# Patient Record
Sex: Female | Born: 1945 | Race: White | Hispanic: No | Smoking: Current every day smoker
Health system: Southern US, Community
[De-identification: ages and names within clinical notes are randomized; demographics above are authoritative.]

## PROBLEM LIST (undated history)

## (undated) DIAGNOSIS — M51369 Other intervertebral disc degeneration, lumbar region without mention of lumbar back pain or lower extremity pain: Secondary | ICD-10-CM

## (undated) DIAGNOSIS — J449 Chronic obstructive pulmonary disease, unspecified: Secondary | ICD-10-CM

## (undated) DIAGNOSIS — F410 Panic disorder [episodic paroxysmal anxiety] without agoraphobia: Secondary | ICD-10-CM

## (undated) DIAGNOSIS — I341 Nonrheumatic mitral (valve) prolapse: Secondary | ICD-10-CM

## (undated) DIAGNOSIS — M5136 Other intervertebral disc degeneration, lumbar region: Secondary | ICD-10-CM

## (undated) DIAGNOSIS — E7800 Pure hypercholesterolemia, unspecified: Secondary | ICD-10-CM

## (undated) DIAGNOSIS — K5790 Diverticulosis of intestine, part unspecified, without perforation or abscess without bleeding: Secondary | ICD-10-CM

## (undated) HISTORY — PX: ABDOMINAL HYSTERECTOMY: SHX81

---

## 2013-05-10 DIAGNOSIS — J40 Bronchitis, not specified as acute or chronic: Secondary | ICD-10-CM | POA: Diagnosis not present

## 2013-05-10 DIAGNOSIS — M545 Low back pain, unspecified: Secondary | ICD-10-CM | POA: Diagnosis not present

## 2013-06-23 DIAGNOSIS — R002 Palpitations: Secondary | ICD-10-CM | POA: Diagnosis not present

## 2013-06-23 DIAGNOSIS — Z0000 Encounter for general adult medical examination without abnormal findings: Secondary | ICD-10-CM | POA: Diagnosis not present

## 2013-06-23 DIAGNOSIS — J449 Chronic obstructive pulmonary disease, unspecified: Secondary | ICD-10-CM | POA: Diagnosis not present

## 2013-06-23 DIAGNOSIS — K5730 Diverticulosis of large intestine without perforation or abscess without bleeding: Secondary | ICD-10-CM | POA: Diagnosis not present

## 2013-06-23 DIAGNOSIS — E559 Vitamin D deficiency, unspecified: Secondary | ICD-10-CM | POA: Diagnosis not present

## 2013-06-23 DIAGNOSIS — E041 Nontoxic single thyroid nodule: Secondary | ICD-10-CM | POA: Diagnosis not present

## 2013-07-19 DIAGNOSIS — J45909 Unspecified asthma, uncomplicated: Secondary | ICD-10-CM | POA: Diagnosis not present

## 2013-07-19 DIAGNOSIS — R059 Cough, unspecified: Secondary | ICD-10-CM | POA: Diagnosis not present

## 2013-07-19 DIAGNOSIS — M949 Disorder of cartilage, unspecified: Secondary | ICD-10-CM | POA: Diagnosis not present

## 2013-07-19 DIAGNOSIS — R0602 Shortness of breath: Secondary | ICD-10-CM | POA: Diagnosis not present

## 2013-07-19 DIAGNOSIS — E785 Hyperlipidemia, unspecified: Secondary | ICD-10-CM | POA: Diagnosis not present

## 2013-07-19 DIAGNOSIS — N958 Other specified menopausal and perimenopausal disorders: Secondary | ICD-10-CM | POA: Diagnosis not present

## 2013-07-19 DIAGNOSIS — J019 Acute sinusitis, unspecified: Secondary | ICD-10-CM | POA: Diagnosis not present

## 2013-07-19 DIAGNOSIS — R05 Cough: Secondary | ICD-10-CM | POA: Diagnosis not present

## 2013-07-19 DIAGNOSIS — M899 Disorder of bone, unspecified: Secondary | ICD-10-CM | POA: Diagnosis not present

## 2013-07-19 DIAGNOSIS — E041 Nontoxic single thyroid nodule: Secondary | ICD-10-CM | POA: Diagnosis not present

## 2013-07-19 DIAGNOSIS — R51 Headache: Secondary | ICD-10-CM | POA: Diagnosis not present

## 2013-07-19 DIAGNOSIS — Z79899 Other long term (current) drug therapy: Secondary | ICD-10-CM | POA: Diagnosis not present

## 2013-07-20 DIAGNOSIS — K921 Melena: Secondary | ICD-10-CM | POA: Diagnosis not present

## 2013-08-17 DIAGNOSIS — J984 Other disorders of lung: Secondary | ICD-10-CM | POA: Diagnosis not present

## 2013-08-17 DIAGNOSIS — M542 Cervicalgia: Secondary | ICD-10-CM | POA: Diagnosis not present

## 2013-08-17 DIAGNOSIS — I369 Nonrheumatic tricuspid valve disorder, unspecified: Secondary | ICD-10-CM | POA: Diagnosis not present

## 2013-08-17 DIAGNOSIS — I359 Nonrheumatic aortic valve disorder, unspecified: Secondary | ICD-10-CM | POA: Diagnosis not present

## 2013-08-17 DIAGNOSIS — I672 Cerebral atherosclerosis: Secondary | ICD-10-CM | POA: Diagnosis not present

## 2013-08-17 DIAGNOSIS — R220 Localized swelling, mass and lump, head: Secondary | ICD-10-CM | POA: Diagnosis not present

## 2013-08-17 DIAGNOSIS — M25549 Pain in joints of unspecified hand: Secondary | ICD-10-CM | POA: Diagnosis not present

## 2013-08-17 DIAGNOSIS — R0602 Shortness of breath: Secondary | ICD-10-CM | POA: Diagnosis not present

## 2013-08-17 DIAGNOSIS — R221 Localized swelling, mass and lump, neck: Secondary | ICD-10-CM | POA: Diagnosis not present

## 2013-08-17 DIAGNOSIS — J309 Allergic rhinitis, unspecified: Secondary | ICD-10-CM | POA: Diagnosis not present

## 2013-08-17 DIAGNOSIS — I059 Rheumatic mitral valve disease, unspecified: Secondary | ICD-10-CM | POA: Diagnosis not present

## 2013-09-30 DIAGNOSIS — R109 Unspecified abdominal pain: Secondary | ICD-10-CM | POA: Diagnosis not present

## 2013-09-30 DIAGNOSIS — I2510 Atherosclerotic heart disease of native coronary artery without angina pectoris: Secondary | ICD-10-CM | POA: Diagnosis not present

## 2013-09-30 DIAGNOSIS — M899 Disorder of bone, unspecified: Secondary | ICD-10-CM | POA: Diagnosis not present

## 2013-09-30 DIAGNOSIS — E039 Hypothyroidism, unspecified: Secondary | ICD-10-CM | POA: Diagnosis not present

## 2013-12-09 DIAGNOSIS — H43819 Vitreous degeneration, unspecified eye: Secondary | ICD-10-CM | POA: Diagnosis not present

## 2013-12-15 DIAGNOSIS — Z1231 Encounter for screening mammogram for malignant neoplasm of breast: Secondary | ICD-10-CM | POA: Diagnosis not present

## 2014-01-20 DIAGNOSIS — B009 Herpesviral infection, unspecified: Secondary | ICD-10-CM | POA: Diagnosis not present

## 2014-01-20 DIAGNOSIS — F4389 Other reactions to severe stress: Secondary | ICD-10-CM | POA: Diagnosis not present

## 2014-01-20 DIAGNOSIS — Z23 Encounter for immunization: Secondary | ICD-10-CM | POA: Diagnosis not present

## 2014-01-20 DIAGNOSIS — F438 Other reactions to severe stress: Secondary | ICD-10-CM | POA: Diagnosis not present

## 2014-01-20 DIAGNOSIS — R002 Palpitations: Secondary | ICD-10-CM | POA: Diagnosis not present

## 2014-01-20 DIAGNOSIS — H6690 Otitis media, unspecified, unspecified ear: Secondary | ICD-10-CM | POA: Diagnosis not present

## 2014-01-21 DIAGNOSIS — N958 Other specified menopausal and perimenopausal disorders: Secondary | ICD-10-CM | POA: Diagnosis not present

## 2014-02-02 DIAGNOSIS — J449 Chronic obstructive pulmonary disease, unspecified: Secondary | ICD-10-CM | POA: Diagnosis not present

## 2014-02-02 DIAGNOSIS — R109 Unspecified abdominal pain: Secondary | ICD-10-CM | POA: Diagnosis not present

## 2014-02-02 DIAGNOSIS — F411 Generalized anxiety disorder: Secondary | ICD-10-CM | POA: Diagnosis not present

## 2014-02-02 DIAGNOSIS — N958 Other specified menopausal and perimenopausal disorders: Secondary | ICD-10-CM | POA: Diagnosis not present

## 2014-02-09 DIAGNOSIS — R102 Pelvic and perineal pain: Secondary | ICD-10-CM | POA: Diagnosis not present

## 2014-02-16 DIAGNOSIS — K824 Cholesterolosis of gallbladder: Secondary | ICD-10-CM | POA: Diagnosis not present

## 2014-02-16 DIAGNOSIS — D1803 Hemangioma of intra-abdominal structures: Secondary | ICD-10-CM | POA: Diagnosis not present

## 2014-04-16 ENCOUNTER — Encounter (HOSPITAL_BASED_OUTPATIENT_CLINIC_OR_DEPARTMENT_OTHER): Payer: Self-pay | Admitting: *Deleted

## 2014-04-16 ENCOUNTER — Emergency Department (HOSPITAL_BASED_OUTPATIENT_CLINIC_OR_DEPARTMENT_OTHER): Payer: Medicare Other

## 2014-04-16 ENCOUNTER — Emergency Department (HOSPITAL_BASED_OUTPATIENT_CLINIC_OR_DEPARTMENT_OTHER)
Admission: EM | Admit: 2014-04-16 | Discharge: 2014-04-16 | Disposition: A | Discharge status: Home / Self Care | Payer: Medicare Other | Attending: Emergency Medicine | Admitting: Emergency Medicine

## 2014-04-16 DIAGNOSIS — F410 Panic disorder [episodic paroxysmal anxiety] without agoraphobia: Secondary | ICD-10-CM | POA: Diagnosis not present

## 2014-04-16 DIAGNOSIS — H9209 Otalgia, unspecified ear: Secondary | ICD-10-CM | POA: Diagnosis not present

## 2014-04-16 DIAGNOSIS — N3000 Acute cystitis without hematuria: Secondary | ICD-10-CM | POA: Diagnosis not present

## 2014-04-16 DIAGNOSIS — R51 Headache: Secondary | ICD-10-CM | POA: Diagnosis not present

## 2014-04-16 DIAGNOSIS — R059 Cough, unspecified: Secondary | ICD-10-CM

## 2014-04-16 DIAGNOSIS — Z8679 Personal history of other diseases of the circulatory system: Secondary | ICD-10-CM | POA: Insufficient documentation

## 2014-04-16 DIAGNOSIS — Z79899 Other long term (current) drug therapy: Secondary | ICD-10-CM | POA: Diagnosis not present

## 2014-04-16 DIAGNOSIS — B9689 Other specified bacterial agents as the cause of diseases classified elsewhere: Secondary | ICD-10-CM | POA: Diagnosis not present

## 2014-04-16 DIAGNOSIS — M791 Myalgia: Secondary | ICD-10-CM | POA: Insufficient documentation

## 2014-04-16 DIAGNOSIS — Z720 Tobacco use: Secondary | ICD-10-CM | POA: Insufficient documentation

## 2014-04-16 DIAGNOSIS — R509 Fever, unspecified: Secondary | ICD-10-CM

## 2014-04-16 DIAGNOSIS — Z7951 Long term (current) use of inhaled steroids: Secondary | ICD-10-CM | POA: Diagnosis not present

## 2014-04-16 DIAGNOSIS — R0989 Other specified symptoms and signs involving the circulatory and respiratory systems: Secondary | ICD-10-CM | POA: Diagnosis not present

## 2014-04-16 DIAGNOSIS — J449 Chronic obstructive pulmonary disease, unspecified: Secondary | ICD-10-CM | POA: Insufficient documentation

## 2014-04-16 DIAGNOSIS — Z8719 Personal history of other diseases of the digestive system: Secondary | ICD-10-CM | POA: Diagnosis not present

## 2014-04-16 DIAGNOSIS — E780 Pure hypercholesterolemia: Secondary | ICD-10-CM | POA: Diagnosis not present

## 2014-04-16 DIAGNOSIS — R05 Cough: Secondary | ICD-10-CM

## 2014-04-16 HISTORY — DX: Diverticulosis of intestine, part unspecified, without perforation or abscess without bleeding: K57.90

## 2014-04-16 HISTORY — DX: Other intervertebral disc degeneration, lumbar region: M51.36

## 2014-04-16 HISTORY — DX: Nonrheumatic mitral (valve) prolapse: I34.1

## 2014-04-16 HISTORY — DX: Chronic obstructive pulmonary disease, unspecified: J44.9

## 2014-04-16 HISTORY — DX: Panic disorder (episodic paroxysmal anxiety): F41.0

## 2014-04-16 HISTORY — DX: Pure hypercholesterolemia, unspecified: E78.00

## 2014-04-16 HISTORY — DX: Other intervertebral disc degeneration, lumbar region without mention of lumbar back pain or lower extremity pain: M51.369

## 2014-04-16 LAB — CBC WITH DIFFERENTIAL/PLATELET
Basophils Absolute: 0 10*3/uL (ref 0.0–0.1)
Basophils Relative: 0 % (ref 0–1)
Eosinophils Absolute: 0 10*3/uL (ref 0.0–0.7)
Eosinophils Relative: 0 % (ref 0–5)
HCT: 43.4 % (ref 36.0–46.0)
Hemoglobin: 14.4 g/dL (ref 12.0–15.0)
Lymphocytes Relative: 6 % — ABNORMAL LOW (ref 12–46)
Lymphs Abs: 1 10*3/uL (ref 0.7–4.0)
MCH: 32.7 pg (ref 26.0–34.0)
MCHC: 33.2 g/dL (ref 30.0–36.0)
MCV: 98.4 fL (ref 78.0–100.0)
Monocytes Absolute: 1 10*3/uL (ref 0.1–1.0)
Monocytes Relative: 7 % (ref 3–12)
Neutro Abs: 12.9 10*3/uL — ABNORMAL HIGH (ref 1.7–7.7)
Neutrophils Relative %: 87 % — ABNORMAL HIGH (ref 43–77)
Platelets: 175 10*3/uL (ref 150–400)
RBC: 4.41 MIL/uL (ref 3.87–5.11)
RDW: 13.2 % (ref 11.5–15.5)
WBC: 14.9 10*3/uL — ABNORMAL HIGH (ref 4.0–10.5)

## 2014-04-16 LAB — URINALYSIS, ROUTINE W REFLEX MICROSCOPIC
Bilirubin Urine: NEGATIVE
Glucose, UA: NEGATIVE mg/dL
Hgb urine dipstick: NEGATIVE
Ketones, ur: NEGATIVE mg/dL
Nitrite: NEGATIVE
Protein, ur: NEGATIVE mg/dL
Specific Gravity, Urine: 1.006 (ref 1.005–1.030)
Urobilinogen, UA: 0.2 mg/dL (ref 0.0–1.0)
pH: 5.5 (ref 5.0–8.0)

## 2014-04-16 LAB — COMPREHENSIVE METABOLIC PANEL
ALT: 31 U/L (ref 0–35)
AST: 36 U/L (ref 0–37)
Albumin: 3.7 g/dL (ref 3.5–5.2)
Alkaline Phosphatase: 89 U/L (ref 39–117)
Anion gap: 15 (ref 5–15)
BUN: 12 mg/dL (ref 6–23)
CO2: 23 mEq/L (ref 19–32)
Calcium: 9.5 mg/dL (ref 8.4–10.5)
Chloride: 101 mEq/L (ref 96–112)
Creatinine, Ser: 0.8 mg/dL (ref 0.50–1.10)
GFR calc Af Amer: 86 mL/min — ABNORMAL LOW (ref >90)
GFR calc non Af Amer: 74 mL/min — ABNORMAL LOW (ref >90)
Glucose, Bld: 156 mg/dL — ABNORMAL HIGH (ref 70–99)
Potassium: 4.1 mEq/L (ref 3.7–5.3)
Sodium: 139 mEq/L (ref 137–147)
Total Bilirubin: 0.3 mg/dL (ref 0.3–1.2)
Total Protein: 7.3 g/dL (ref 6.0–8.3)

## 2014-04-16 LAB — URINE MICROSCOPIC-ADD ON

## 2014-04-16 LAB — LIPASE, BLOOD: Lipase: 56 U/L (ref 11–59)

## 2014-04-16 LAB — RAPID STREP SCREEN (MED CTR MEBANE ONLY): Streptococcus, Group A Screen (Direct): NEGATIVE

## 2014-04-16 MED ORDER — SODIUM CHLORIDE 0.9 % IV BOLUS (SEPSIS)
1000.0000 mL | Freq: Once | INTRAVENOUS | Status: AC
  Administered 2014-04-16: 1000 mL via INTRAVENOUS

## 2014-04-16 MED ORDER — ACETAMINOPHEN 325 MG PO TABS
650.0000 mg | ORAL_TABLET | Freq: Once | ORAL | Status: AC
Start: 2014-04-16 — End: 2014-04-16
  Administered 2014-04-16: 650 mg via ORAL
  Filled 2014-04-16: qty 2

## 2014-04-16 MED ORDER — CEPHALEXIN 500 MG PO CAPS: 500.0000 mg | ORAL_CAPSULE | Freq: Two times a day (BID) | ORAL | Status: AC

## 2014-04-16 NOTE — Discharge Instructions (Signed)
Fever, Adult A fever is a higher than normal body temperature. In an adult, an oral temperature around 98.6 F (37 C) is considered normal. A temperature of 100.4 F (38 C) or higher is generally considered a fever. Mild or moderate fevers generally have no long-term effects and often do not require treatment. Extreme fever (greater than or equal to 106 F or 41.1 C) can cause seizures. The sweating that may occur with repeated or prolonged fever may cause dehydration. Elderly people can develop confusion during a fever. A measured temperature can vary with:  Age.  Time of day.  Method of measurement (mouth, underarm, rectal, or ear). The fever is confirmed by taking a temperature with a thermometer. Temperatures can be taken different ways. Some methods are accurate and some are not.  An oral temperature is used most commonly. Electronic thermometers are fast and accurate.  An ear temperature will only be accurate if the thermometer is positioned as recommended by the manufacturer.  A rectal temperature is accurate and done for those adults who have a condition where an oral temperature cannot be taken.  An underarm (axillary) temperature is not accurate and not recommended. Fever is a symptom, not a disease.  CAUSES   Infections commonly cause fever.  Some noninfectious causes for fever include:  Some arthritis conditions.  Some thyroid or adrenal gland conditions.  Some immune system conditions.  Some types of cancer.  A medicine reaction.  High doses of certain street drugs such as methamphetamine.  Dehydration.  Exposure to high outside or room temperatures.  Occasionally, the source of a fever cannot be determined. This is sometimes called a "fever of unknown origin" (FUO).  Some situations may lead to a temporary rise in body temperature that may go away on its own. Examples are:  Childbirth.  Surgery.  Intense exercise. HOME CARE INSTRUCTIONS   Take  appropriate medicines for fever. Follow dosing instructions carefully. If you use acetaminophen to reduce the fever, be careful to avoid taking other medicines that also contain acetaminophen. Do not take aspirin for a fever if you are younger than age 67. There is an association with Reye's syndrome. Reye's syndrome is a rare but potentially deadly disease.  If an infection is present and antibiotics have been prescribed, take them as directed. Finish them even if you start to feel better.  Rest as needed.  Maintain an adequate fluid intake. To prevent dehydration during an illness with prolonged or recurrent fever, you may need to drink extra fluid.Drink enough fluids to keep your urine clear or pale yellow.  Sponging or bathing with room temperature water may help reduce body temperature. Do not use ice water or alcohol sponge baths.  Dress comfortably, but do not over-bundle. SEEK MEDICAL CARE IF:   You are unable to keep fluids down.  You develop vomiting or diarrhea.  You are not feeling at least partly better after 3 days.  You develop new symptoms or problems. SEEK IMMEDIATE MEDICAL CARE IF:   You have shortness of breath or trouble breathing.  You develop excessive weakness.  You are dizzy or you faint.  You are extremely thirsty or you are making little or no urine.  You develop new pain that was not there before (such as in the head, neck, chest, back, or abdomen).  You have persistent vomiting and diarrhea for more than 1 to 2 days.  You develop a stiff neck or your eyes become sensitive to light.  You develop a  skin rash. °· You have a fever or persistent symptoms for more than 2 to 3 days. °· You have a fever and your symptoms suddenly get worse. °MAKE SURE YOU:  °· Understand these instructions. °· Will watch your condition. °· Will get help right away if you are not doing well or get worse. °Document Released: 10/16/2000 Document Revised: 09/06/2013 Document  Reviewed: 02/21/2011 °ExitCare® Patient Information ©2015 [employer]. This information is not intended to replace advice given to you by your health care provider. Make sure you discuss any questions you have with your health care provider. ° °Urinary Tract Infection °Urinary tract infections (UTIs) can develop anywhere along your urinary tract. Your urinary tract is your body's drainage system for removing wastes and extra water. Your urinary tract includes two kidneys, two ureters, a bladder, and a urethra. Your kidneys are a pair of bean-shaped organs. Each kidney is about the size of your fist. They are located below your ribs, one on each side of your spine. °CAUSES °Infections are caused by microbes, which are microscopic organisms, including fungi, viruses, and bacteria. These organisms are so small that they can only be seen through a microscope. Bacteria are the microbes that most commonly cause UTIs. °SYMPTOMS  °Symptoms of UTIs may vary by age and gender of the patient and by the location of the infection. Symptoms in young women typically include a frequent and intense urge to urinate and a painful, burning feeling in the bladder or urethra during urination. Older women and men are more likely to be tired, shaky, and weak and have muscle aches and abdominal pain. A fever may mean the infection is in your kidneys. Other symptoms of a kidney infection include pain in your back or sides below the ribs, nausea, and vomiting. °DIAGNOSIS °To diagnose a UTI, your caregiver will ask you about your symptoms. Your caregiver also will ask to provide a urine sample. The urine sample will be tested for bacteria and white blood cells. White blood cells are made by your body to help fight infection. °TREATMENT  °Typically, UTIs can be treated with medication. Because most UTIs are caused by a bacterial infection, they usually can be treated with the use of antibiotics. The choice of antibiotic and length of  treatment depend on your symptoms and the type of bacteria causing your infection. °HOME CARE INSTRUCTIONS °· If you were prescribed antibiotics, take them exactly as your caregiver instructs you. Finish the medication even if you feel better after you have only taken some of the medication. °· Drink enough water and fluids to keep your urine clear or pale yellow. °· Avoid caffeine, tea, and carbonated beverages. They tend to irritate your bladder. °· Empty your bladder often. Avoid holding urine for long periods of time. °· Empty your bladder before and after sexual intercourse. °· After a bowel movement, women should cleanse from front to back. Use each tissue only once. °SEEK MEDICAL CARE IF:  °· You have back pain. °· You develop a fever. °· Your symptoms do not begin to resolve within 3 days. °SEEK IMMEDIATE MEDICAL CARE IF:  °· You have severe back pain or lower abdominal pain. °· You develop chills. °· You have nausea or vomiting. °· You have continued burning or discomfort with urination. °MAKE SURE YOU:  °· Understand these instructions. °· Will watch your condition. °· Will get help right away if you are not doing well or get worse. °Document Released: 01/30/2005 Document Revised: 10/22/2011 Document Reviewed: 05/31/2011 °  ExitCare Patient Information 2015 [place]. This information is not intended to replace advice given to you by your health care provider. Make sure you discuss any questions you have with your health care provider.   Emergency Department Resource Guide 1) Find a Doctor and Pay Out of Pocket Although you won't have to find out who is covered by your insurance plan, it is a good idea to ask around and get recommendations. You will then need to call the office and see if the doctor you have chosen will accept you as a new patient and what types of options they offer for patients who are self-pay. Some doctors offer discounts or will set up payment plans for their patients who do  not have insurance, but you will need to ask so you aren't surprised when you get to your appointment.  2) Contact Your Local Health Department Not all health departments have doctors that can see patients for sick visits, but many do, so it is worth a call to see if yours does. If you don't know where your local health department is, you can check in your phone book. The CDC also has a tool to help you locate your state's health department, and many state websites also have listings of all of their local health departments.  3) Find a [facility] If your illness is not likely to be very severe or complicated, you may want to try a walk in clinic. These are popping up all over the country in pharmacies, drugstores, and shopping centers. They're usually staffed by nurse practitioners or physician assistants that have been trained to treat common illnesses and complaints. They're usually fairly quick and inexpensive. However, if you have serious medical issues or chronic medical problems, these are probably not your best option.  No Primary Care Doctor: - Call Health Connect at  [phone] - they can help you locate a primary care doctor that  accepts your insurance, provides certain services, etc. - Physician Referral Service- [phone]  Chronic Pain Problems: Organization         Address  Phone   Notes  [facility]  [phone] Patients need to be referred by their primary care doctor.   Medication Assistance: Organization         Address  Phone   Notes  [facility] Medication [facility] [address] [place], [place] [address] [phone] --Must be a resident of [facility] -- Must have NO insurance coverage whatsoever (no Medicaid/ Medicare, etc.) -- The pt. MUST have a primary care doctor that directs their care regularly and follows them in the community   [facility]ist  [phone]   [employer]  [phone]    Agencies that  provide inexpensive medical care: Organization         Address  Phone   Notes  [place]  [phone]   [facility]    [phone]   [facility] [place], [place] [address] [phone]   [place] [address], [place] [phone]   Planned Parenthood    [phone]   [facility]    [phone]   [place] and [place] [address], [place] Phone:  [phone], Fax:  [phone] Hours of Operation:  9 am - 6 pm, M-F.  Also accepts Medicaid/Medicare and self-pay.  [place]  Center for [place] [address], [address], [place] Phone: [phone], Fax: [phone]. Hours of Operation:  8:30 am - 5:30 pm, M-F.  Also accepts Medicaid and self-pay.  [facility] [place] [address], [place] Phone: [phone]   [place], [place], [place] [phone], Ext. [phone] Mondays & Thursdays: 7-9 AM.  First 15 patients are seen on a first come, first serve basis.    [place] Providers:  Organization         Address  Phone   Notes  [facility] [address], [place] [phone] Also accepts self-pay patients.  [facility] [address], [place]  [phone]   [place], [address], [place] [phone]   [facility] [address], [place] [phone]   Lucianne Lei [address], [place]   [phone] Only accepts [place] Access [place] patients after they have their name applied to their card.   Self-Pay (no insurance) in [facility]:  Organization         Address  Phone   Notes  Sickle Cell Patients, [facility] [place] [phone]   [facility]  Urgent Care [place] [phone]   [facility] [place]  [place], [place], [place] [phone]   [facility]/Dr. Osei-Bonsu  [address], [place] or [address], [place] [phone] Phone number for both [place] and [place] locations is the same.  Urgent Medical and [facility] [place] [address] [place] [phone]   [facility] [address] [place] or [address] [phone]   [facility] [address] [place] [phone], phone; [phone], fax Sees patients 1st and 3rd Saturday of every month.  Must not qualify for public or private insurance (i.e. Medicaid, Medicare, [place] Health Choice, Veterans' Benefits)  Household income should be no more than 200% of the poverty level The clinic cannot treat you if you are pregnant or think you are pregnant  Sexually transmitted diseases are not treated at the clinic.    Dental Care: Organization         Address  Phone  Notes  [facility] Department of [facility] [place] [phone] Accepts children up to age 38 who are enrolled in [place] or [place]; pregnant women with a Medicaid card; and children who have applied for Medicaid or [place] Health Choice, but were declined, whose parents can pay a reduced fee at time of service.  [facility] of [facility]  [address], [place] [phone] Accepts children up to age 40 who are enrolled in [place] or [place]; pregnant women with a Medicaid card; and children who have applied for Medicaid or [place] Health Choice, but were declined, whose parents can pay a reduced fee at time of service.  [place] Adult Dental Access PROGRAM  [place] [phone] Patients are seen by appointment only. Walk-ins  are not accepted. [place] will see patients 41 years of age and older. Monday - Tuesday (8am-5pm) Most Wednesdays (8:30-5pm) $30 per visit, cash  only  [facility] Adult Dental Access PROGRAM  [address], [facility] [phone] Patients are seen by appointment only. Walk-ins are not accepted. [place] will see patients 65 years of age and older. One Wednesday Evening (Monthly: Volunteer Based).  $30 per visit, cash only  [place]  [phone] for adults; Children under age 66, call Graduate Pediatric Dentistry at [phone]. Children aged 82-14, please call [phone] to request a pediatric application.  Dental services are provided in all areas of dental care including fillings, crowns and bridges, complete and partial dentures, implants, gum treatment, root canals, and extractions. Preventive care is also provided. Treatment is provided to both adults and children. Patients are selected via a lottery and there is often a waiting list.   [facility] [address] [place]  [phone] [email or web address]   [facility] [address] [place], [place] [phone], Ext. [phone] Second and Fourth Thursday of each month, opens at 6:30 AM; Clinic ends at 9 AM.  Patients are seen on a first-come first-served basis, and a limited number are seen during each clinic.   [facility]  [address] [place] D[place]er [place], [place] [phone]   Eligibility Requirements You must have lived in [place], [place], or [place] counties for at least the last three months.   You cannot be eligible for state or federal sponsored [employer], including [employer], [place], or [employer].   You generally cannot be eligible for healthcare insurance through your employer.    How to apply: Eligibility screenings are held every Tuesday and Wednesday afternoon from 1:00 pm until 4:00 pm. You do not need an appointment  for the interview!  [facility] [address] [place], [place]   [place]  [place] Department  [place]  [phone]    Behavioral Health Resources in the Community: Intensive Outpatient Programs Organization         Address  Phone  Notes  [place]. [address] [place], [place] [phone]   [facility] Outpatient [address], [place], [place]   ADS: Alcohol & Drug Svcs [address] [place], [place]   [place] [address],  [place], [place] or [phone]   Substance Abuse Resources Organization         Address  Phone  Notes  Alcohol and Drug Services  [phone]   [place]  [phone]   [facility] [place]   Chinita Pester  [phone]   Residential & Outpatient Substance Abuse Program  [phone]   Psychological Services Organization         Address  Phone  Notes  [facility] [place]  [place]  [phone]   [place] [address], [place] or [phone]    Mobile Crisis Teams Organization         Address  Phone  Notes  [facility], Mobile Crisis Care Unit  [phone]   [facility]  [address] [place], [place]   [place] [address] [place] [phone]    Self-Help/Support Groups Organization         Address  Phone  Notes  [facility]oc[facility] of [place] - variety of support groups  [place] Call for more information  Narcotics Anonymous (NA), [facility] [address], [employer] [place]  2 meetings at this location   Special educational needs teacher         Address  Phone  Notes  ASAP Residential  Treatment [place],    [place]  [phone]   [facility]  [address], [place] [phone], [place], [place]   [place], [place] [phone] Admissions: 8am-3pm M-F  Incentives Substance [place] [address],    [place], [place] [phone]   [facility] [address] [place], [place], [place]   [facility] [address]  [place], [place]   [facility]ight Programs - Intensive Outpatient [address]een [address], [place], [place]   [facility] ([place] [address].,  [place], [place] [phone] or [phone]   [facility]idential Treatment Services (RTS) [address] [place], [place] Accepts Medicaid  [facility] [place] [address],  [place] [phone] Substance Abuse/Addiction Treatment   [facility] Organization         Address  Phone  Notes  [facility]  [phone]   Domenic Schwab, PhD [address] [place]lis [place], [place]   [phone] or [phone]   [place], [place] [phone]   [facility] [address] [place], [place] [phone] Insurance/Medicaid/sponsorship through [facility] and Families [address]                                    [place], [place] [phone] [place] [address], [place] [phone]    Dr. Adele Schilder  [phone]   [facility] Dept. 1) [address], [place] 2) [place] 3)  [place] [address], [place] [phone]  [phone]   [place] [phone] or [phone] (After Hours)

## 2014-04-16 NOTE — ED Provider Notes (Signed)
CSN: [id number]     Arrival date & time 04/16/14  1911 History  This chart was scribed for Artis Delay, MD by Stephania Fragmin, ED Scribe. This patient was seen in room MH08/MH08 and the patient's care was started at 8:09 PM.    Chief Complaint  Patient presents with  . Fever  . Influenza    The history is provided by the patient. No language interpreter was used.     HPI Comments: Chloe Green is a 68 y.o. female who presents to the Emergency Department complaining of cold-like symptoms that began 1.5 weeks ago, went away, and returned with worsening, flu-like symptoms yesterday. She and all her co-workers showed symptoms 3-5 days after sick contact with their boss. Patient complains of leg aches, fever, chills, intermittent ear aches and congestion, a throbbing headache, rhinorrhea alternating with congestion, and productive cough. Her aches, fever, and chills started yesterday; she did not have them when her symptoms started 1.5 weeks ago. Patient took Dayquil about 12 hours ago with minimal relief. She uses an inhaler for COPD that she denies needing to increase in usage with these symptoms. Patient has had a flu shot and pneumonia shot. She denies vomiting and diarrhea.    Past Medical History  Diagnosis Date  . COPD (chronic obstructive pulmonary disease)   . Mitral valve prolapse   . Hypercholesteremia   . Panic attacks   . Diverticulosis   . Degenerative disc disease, lumbar    Past Surgical History  Procedure Laterality Date  . Abdominal hysterectomy     No family history on file. History  Substance Use Topics  . Smoking status: Current Every Day Smoker  . Smokeless tobacco: Not on file  . Alcohol Use: No   OB History    No data available     Review of Systems  Constitutional: Positive for fever and chills.  HENT: Positive for congestion, ear pain, rhinorrhea and sore throat.   Respiratory: Positive for cough. Negative for shortness of breath.   Gastrointestinal:  Negative for vomiting and diarrhea.  Musculoskeletal: Positive for myalgias.  Neurological: Positive for headaches.  All other systems reviewed and are negative.     Allergies  Apple; Azelastine; Ciprofloxacin; Clavulanic acid; Doxycycline; Flagyl; and Other  Home Medications   Prior to Admission medications   Medication Sig Start Date End Date Taking? Authorizing Provider  atorvastatin (LIPITOR) 10 MG tablet Take 10 mg by mouth daily.   Yes Historical Provider, MD  Budesonide-Formoterol Fumarate (SYMBICORT IN) Inhale into the lungs.   Yes Historical Provider, MD  ClonazePAM (KLONOPIN PO) Take by mouth.   Yes Historical Provider, MD  co-enzyme Q-10 30 MG capsule Take by mouth.   Yes Historical Provider, MD  CYCLOBENZAPRINE HCL PO Take by mouth.   Yes Historical Provider, MD  DIAZEPAM PO Take by mouth.   Yes Historical Provider, MD  Dicyclomine HCl (BENTYL PO) Take by mouth.   Yes Historical Provider, MD  Levalbuterol HCl (XOPENEX IN) Inhale into the lungs.   Yes Historical Provider, MD  Nystatin-Triamcinolone (MYCOLOG II EX) Apply topically.   Yes Historical Provider, MD  Probiotic Product (SOLUBLE FIBER/PROBIOTICS PO) Take by mouth.   Yes Historical Provider, MD  Ranitidine HCl (ZANTAC PO) Take by mouth.   Yes Historical Provider, MD   BP 134/56 mmHg  Pulse 116  Temp(Src) 102.6 F (39.2 C) (Oral)  Resp 22  Wt 101 lb 1.6 oz (45.859 kg)  SpO2 93% Physical Exam  Constitutional: She is oriented to  person, place, and time. She appears well-developed and well-nourished. No distress.  HENT:  Head: Normocephalic and atraumatic.  Nose: Right sinus exhibits no maxillary sinus tenderness and no frontal sinus tenderness. Left sinus exhibits no maxillary sinus tenderness and no frontal sinus tenderness.  Mouth/Throat: Oropharynx is clear and moist.  Eyes: Conjunctivae are normal. Pupils are equal, round, and reactive to light. No scleral icterus.  Neck: Neck supple.  Cardiovascular:  Normal rate, regular rhythm, normal heart sounds and intact distal pulses.   No murmur heard. Pulmonary/Chest: Effort normal and breath sounds normal. No stridor. No respiratory distress. She has no wheezes (mild prolongation of expiration without wheezing). She has no rales.  Abdominal: Soft. Bowel sounds are normal. She exhibits no distension. There is no tenderness.  Musculoskeletal: Normal range of motion.  Neurological: She is alert and oriented to person, place, and time.  Skin: Skin is warm and dry. No rash noted.  Psychiatric: She has a normal mood and affect. Her behavior is normal.  Nursing note and vitals reviewed.   ED Course  Procedures (including critical care time)  DIAGNOSTIC STUDIES: Oxygen Saturation is 93% on room air, adequate by my interpretation.    COORDINATION OF CARE: 8:20 PM - Discussed treatment plan with pt at bedside which includes tests for bacterial infection and pt agreed to plan.   Labs Review Labs Reviewed  URINALYSIS, ROUTINE W REFLEX MICROSCOPIC - Abnormal; Notable for the following:    Leukocytes, UA TRACE (*)    All other components within normal limits  CBC WITH DIFFERENTIAL - Abnormal; Notable for the following:    WBC 14.9 (*)    Neutrophils Relative % 87 (*)    Neutro Abs 12.9 (*)    Lymphocytes Relative 6 (*)    All other components within normal limits  COMPREHENSIVE METABOLIC PANEL - Abnormal; Notable for the following:    Glucose, Bld 156 (*)    GFR calc non Af Amer 74 (*)    GFR calc Af Amer 86 (*)    All other components within normal limits  URINE MICROSCOPIC-ADD ON - Abnormal; Notable for the following:    Bacteria, UA MANY (*)    All other components within normal limits  RAPID STREP SCREEN  CULTURE, GROUP A STREP  URINE CULTURE  LIPASE, BLOOD    Imaging Review Dg Chest 2 View  04/16/2014   CLINICAL DATA:  Patient complaining of cough and congestion for 1 and half weeks. Fever today. History of COPD.  EXAM: CHEST  2  VIEW  COMPARISON:  None.  FINDINGS: Heart, mediastinum and hila are unremarkable.  Lungs are mildly hyperexpanded but clear. No pleural effusion. No pneumothorax.  Bony thorax is intact.  IMPRESSION: No acute cardiopulmonary disease.   Electronically Signed   By: Lajean Manes M.D.   On: 04/16/2014 20:14  All radiology studies independently viewed by me.      EKG Interpretation None      MDM   Final diagnoses:  Fever, unspecified fever cause  Acute cystitis without hematuria     68 yo female with 1.5 weeks of URI symptoms who now has worsening of her symptoms since last night and onset of fever, chills, and aches.  She is ill appearing, but nontoxic.  Her CXR is negative.  Plan to check labs, give antipyretics.  Initial impression is that she appears well enough to be discharged if her workup is unremarkable.      Workup notable for many urinary bacteria.  She has not had urinary symptoms.  However, given fever and leukocytosis, I feel she should be treated with abx.  Will send culture.  Flu also considered, but she had flu shot and has had URI symptoms for over a week.  I don't think tamiflu would be helpful.  Ultimately, she feels better and wants to go home.  She agrees to return if her symptoms worsen.    I personally performed the services described in this documentation, which was scribed in my presence. The recorded information has been reviewed and is accurate.       Artis Delay, MD 04/16/14 [phone]

## 2014-04-16 NOTE — ED Notes (Addendum)
C/o flu like sx for last 1.5 weeks. Recent sick contacts. Reports cough, congestion, fever, HA, body aches, bilateral earache, (denies: nvd, sob or dizziness).  Also sore throat and RUQ pain. No meds PTA. Congested cough noted. Smoker with COPD. Alert, NAD, calm.

## 2014-04-18 LAB — URINE CULTURE
Colony Count: NO GROWTH
Culture: NO GROWTH

## 2014-04-19 LAB — CULTURE, GROUP A STREP

## 2014-05-11 DIAGNOSIS — H6982 Other specified disorders of Eustachian tube, left ear: Secondary | ICD-10-CM | POA: Diagnosis not present

## 2014-05-11 DIAGNOSIS — N3090 Cystitis, unspecified without hematuria: Secondary | ICD-10-CM | POA: Diagnosis not present

## 2014-05-11 DIAGNOSIS — J40 Bronchitis, not specified as acute or chronic: Secondary | ICD-10-CM | POA: Diagnosis not present

## 2014-10-19 ENCOUNTER — Other Ambulatory Visit: Payer: Self-pay | Admitting: Internal Medicine

## 2014-10-19 DIAGNOSIS — Z1231 Encounter for screening mammogram for malignant neoplasm of breast: Secondary | ICD-10-CM

## 2014-10-19 DIAGNOSIS — M5136 Other intervertebral disc degeneration, lumbar region: Secondary | ICD-10-CM | POA: Diagnosis not present

## 2014-10-19 DIAGNOSIS — E780 Pure hypercholesterolemia: Secondary | ICD-10-CM | POA: Diagnosis not present

## 2014-10-19 DIAGNOSIS — F419 Anxiety disorder, unspecified: Secondary | ICD-10-CM | POA: Diagnosis not present

## 2014-10-19 DIAGNOSIS — M810 Age-related osteoporosis without current pathological fracture: Secondary | ICD-10-CM | POA: Diagnosis not present

## 2014-10-19 DIAGNOSIS — F17200 Nicotine dependence, unspecified, uncomplicated: Secondary | ICD-10-CM | POA: Diagnosis not present

## 2014-10-19 DIAGNOSIS — J449 Chronic obstructive pulmonary disease, unspecified: Secondary | ICD-10-CM | POA: Diagnosis not present

## 2014-10-19 DIAGNOSIS — M25562 Pain in left knee: Secondary | ICD-10-CM | POA: Diagnosis not present

## 2014-12-02 ENCOUNTER — Ambulatory Visit
Admission: RE | Admit: 2014-12-02 | Discharge: 2014-12-02 | Disposition: A | Discharge status: Home / Self Care | Payer: Medicare Other | Source: Ambulatory Visit | Attending: Internal Medicine | Admitting: Internal Medicine

## 2014-12-02 DIAGNOSIS — Z1231 Encounter for screening mammogram for malignant neoplasm of breast: Secondary | ICD-10-CM | POA: Diagnosis not present

## 2014-12-16 DIAGNOSIS — H2513 Age-related nuclear cataract, bilateral: Secondary | ICD-10-CM | POA: Diagnosis not present

## 2015-01-05 DIAGNOSIS — Z23 Encounter for immunization: Secondary | ICD-10-CM | POA: Diagnosis not present

## 2015-01-06 DIAGNOSIS — M810 Age-related osteoporosis without current pathological fracture: Secondary | ICD-10-CM | POA: Diagnosis not present

## 2015-01-06 DIAGNOSIS — M5136 Other intervertebral disc degeneration, lumbar region: Secondary | ICD-10-CM | POA: Diagnosis not present

## 2015-01-06 DIAGNOSIS — J449 Chronic obstructive pulmonary disease, unspecified: Secondary | ICD-10-CM | POA: Diagnosis not present

## 2015-01-06 DIAGNOSIS — F17200 Nicotine dependence, unspecified, uncomplicated: Secondary | ICD-10-CM | POA: Diagnosis not present

## 2015-01-06 DIAGNOSIS — E780 Pure hypercholesterolemia: Secondary | ICD-10-CM | POA: Diagnosis not present

## 2015-01-06 DIAGNOSIS — E559 Vitamin D deficiency, unspecified: Secondary | ICD-10-CM | POA: Diagnosis not present

## 2015-01-06 DIAGNOSIS — F419 Anxiety disorder, unspecified: Secondary | ICD-10-CM | POA: Diagnosis not present

## 2015-01-06 DIAGNOSIS — Z1389 Encounter for screening for other disorder: Secondary | ICD-10-CM | POA: Diagnosis not present

## 2015-01-06 DIAGNOSIS — Z0001 Encounter for general adult medical examination with abnormal findings: Secondary | ICD-10-CM | POA: Diagnosis not present

## 2015-01-06 DIAGNOSIS — Z79899 Other long term (current) drug therapy: Secondary | ICD-10-CM | POA: Diagnosis not present

## 2015-01-06 DIAGNOSIS — M25562 Pain in left knee: Secondary | ICD-10-CM | POA: Diagnosis not present

## 2015-01-18 ENCOUNTER — Encounter: Payer: Self-pay | Admitting: Acute Care

## 2015-01-20 ENCOUNTER — Other Ambulatory Visit: Payer: Self-pay | Admitting: Acute Care

## 2015-01-20 DIAGNOSIS — F17210 Nicotine dependence, cigarettes, uncomplicated: Secondary | ICD-10-CM

## 2015-02-02 ENCOUNTER — Inpatient Hospital Stay: Admission: RE | Admit: 2015-02-02 | Payer: Medicare Other | Source: Ambulatory Visit

## 2015-02-02 ENCOUNTER — Encounter: Payer: Medicare Other | Admitting: Acute Care

## 2015-02-09 ENCOUNTER — Ambulatory Visit (INDEPENDENT_AMBULATORY_CARE_PROVIDER_SITE_OTHER): Payer: Medicare Other | Admitting: Acute Care

## 2015-02-09 ENCOUNTER — Encounter: Payer: Self-pay | Admitting: Acute Care

## 2015-02-09 ENCOUNTER — Telehealth: Payer: Self-pay | Admitting: Acute Care

## 2015-02-09 ENCOUNTER — Ambulatory Visit (INDEPENDENT_AMBULATORY_CARE_PROVIDER_SITE_OTHER)
Admission: RE | Admit: 2015-02-09 | Discharge: 2015-02-09 | Disposition: A | Discharge status: Home / Self Care | Payer: Medicare Other | Source: Ambulatory Visit | Attending: Acute Care | Admitting: Acute Care

## 2015-02-09 DIAGNOSIS — Z87891 Personal history of nicotine dependence: Secondary | ICD-10-CM | POA: Diagnosis not present

## 2015-02-09 DIAGNOSIS — F17210 Nicotine dependence, cigarettes, uncomplicated: Secondary | ICD-10-CM

## 2015-02-09 NOTE — Progress Notes (Signed)
Shared Decision Making Visit Lung Cancer Screening Program [phone])   Eligibility:  Age 69 y.o.  Pack Years Smoking History Calculation 45 pack years (# packs/per year x # years smoked)  Recent History of coughing up blood  no  Unexplained weight loss? no ( >Than 15 pounds within the last 6 months )  Prior History Lung / other cancer no (Diagnosis within the last 5 years already requiring surveillance chest CT Scans).  Smoking Status Current Smoker  Former Smokers: Years since quit: NA  Quit Date: NA  Visit Components:  Discussion included one or more decision making aids. yes  Discussion included risk/benefits of screening. yes  Discussion included potential follow up diagnostic testing for abnormal scans. yes  Discussion included meaning and risk of over diagnosis. yes  Discussion included meaning and risk of False Positives. yes  Discussion included meaning of total radiation exposure. yes  Counseling Included:  Importance of adherence to annual lung cancer LDCT screening. yes  Impact of comorbidities on ability to participate in the program. yes  Ability and willingness to under diagnostic treatment. yes  Smoking Cessation Counseling:  Current Smokers:   Discussed importance of smoking cessation. yes  Information about tobacco cessation classes and interventions provided to patient. yes  Patient provided with "ticket" for LDCT Scan. yes  Symptomatic Patient. no  Counseling:NA  Diagnosis Code: Tobacco Use Z72.0  Asymptomatic Patient yes  Counseling (Intermediate counseling: > three minutes counseling) G2952  Former Smokers:   Discussed the importance of maintaining cigarette abstinence. Current smoker  Diagnosis Code: Personal History of Nicotine Dependence. W41.324  Information about tobacco cessation classes and interventions provided to patient. Yes  Patient provided with "ticket" for LDCT Scan. yes  Written Order for Lung Cancer  Screening with LDCT placed in Epic. Yes (CT Chest Lung Cancer Screening Low Dose W/O CM) [id number] Z12.2-Screening of respiratory organs Z87.891-Personal history of nicotine dependence   I spent 20 minutes of face to face time with Ms. Thall discussing the risks and benefits of lung cancer screening. We viewed a power point together that addressed the above noted topics. We paused and stopped at intervals to allow for questions to be asked and answered to ensure understanding. We discussed that the single most powerful action that Ms. Pichette can take to decrease her risk of lung cancer is to quit smoking. She is interested in quitting. I have given her a copy of the Quit Smart class schedule at [facility] starting Oct. 12 th , 2016. They are on a day and time that will work for her, and she plans to register for the class. We discussed the use of nicotine replacement therapy, medication and behavior modification. I gave her the " Be stronger than your excuses " card and encouraged her to utilize the resources to get free nicotine replacement therapy and access to additional smoking cessation aids for free. She has recently moved here from [place], and does not have a support group, which she feels has been a barrier to her ability to quit. I have given her my card and contact information and encouraged her to call me for help when she is ready to set a quit date. We also discussed setting small goals to smoke a few cigarettes less a week, as these may feel more attainable. She verbalized understanding of the time and location of the CT scan, and of the above information. She had no further questions upon leaving the office, but does have my contact information  in the event she needs help with smoking cessation or answers to questions in the future.   Magdalen Spatz, NP

## 2015-02-09 NOTE — Telephone Encounter (Signed)
I called the results of Chloe Green's LDCT. I explained that her scan was read as a lung rads 2, nodules with a very low likelihood of becoming a clinically active cancer based on size and lack of growth. I also explained that the recommendation is for a follow up scan in 12 months. I told her I will call and schedule her for that 3 weeks before it is due in Oct. 2017. I also told her I would fax the results to Dr. Inda Merlin. She verbalized understanding of all of the above, and had no further questions.

## 2015-02-17 ENCOUNTER — Other Ambulatory Visit: Payer: Self-pay | Admitting: Internal Medicine

## 2015-02-17 DIAGNOSIS — E041 Nontoxic single thyroid nodule: Secondary | ICD-10-CM

## 2015-02-22 ENCOUNTER — Other Ambulatory Visit: Payer: Medicare Other

## 2015-03-01 ENCOUNTER — Ambulatory Visit
Admission: RE | Admit: 2015-03-01 | Discharge: 2015-03-01 | Disposition: A | Discharge status: Home / Self Care | Payer: Medicare Other | Source: Ambulatory Visit | Attending: Internal Medicine | Admitting: Internal Medicine

## 2015-03-01 ENCOUNTER — Encounter (INDEPENDENT_AMBULATORY_CARE_PROVIDER_SITE_OTHER): Payer: Self-pay

## 2015-03-01 DIAGNOSIS — E041 Nontoxic single thyroid nodule: Secondary | ICD-10-CM

## 2015-03-01 DIAGNOSIS — E042 Nontoxic multinodular goiter: Secondary | ICD-10-CM | POA: Diagnosis not present

## 2015-03-07 DIAGNOSIS — D229 Melanocytic nevi, unspecified: Secondary | ICD-10-CM

## 2015-03-07 HISTORY — DX: Melanocytic nevi, unspecified: D22.9

## 2015-03-10 DIAGNOSIS — N959 Unspecified menopausal and perimenopausal disorder: Secondary | ICD-10-CM | POA: Diagnosis not present

## 2015-03-22 DIAGNOSIS — L309 Dermatitis, unspecified: Secondary | ICD-10-CM | POA: Diagnosis not present

## 2015-03-22 DIAGNOSIS — B351 Tinea unguium: Secondary | ICD-10-CM | POA: Diagnosis not present

## 2015-03-22 DIAGNOSIS — D225 Melanocytic nevi of trunk: Secondary | ICD-10-CM | POA: Diagnosis not present

## 2015-03-22 DIAGNOSIS — D239 Other benign neoplasm of skin, unspecified: Secondary | ICD-10-CM | POA: Diagnosis not present

## 2015-03-22 DIAGNOSIS — D485 Neoplasm of uncertain behavior of skin: Secondary | ICD-10-CM | POA: Diagnosis not present

## 2015-04-12 DIAGNOSIS — E041 Nontoxic single thyroid nodule: Secondary | ICD-10-CM | POA: Diagnosis not present

## 2015-04-12 DIAGNOSIS — M810 Age-related osteoporosis without current pathological fracture: Secondary | ICD-10-CM | POA: Diagnosis not present

## 2015-04-12 DIAGNOSIS — J40 Bronchitis, not specified as acute or chronic: Secondary | ICD-10-CM | POA: Diagnosis not present

## 2015-04-12 DIAGNOSIS — J449 Chronic obstructive pulmonary disease, unspecified: Secondary | ICD-10-CM | POA: Diagnosis not present

## 2015-04-12 DIAGNOSIS — F17200 Nicotine dependence, unspecified, uncomplicated: Secondary | ICD-10-CM | POA: Diagnosis not present

## 2015-04-12 DIAGNOSIS — M5136 Other intervertebral disc degeneration, lumbar region: Secondary | ICD-10-CM | POA: Diagnosis not present

## 2015-04-12 DIAGNOSIS — F419 Anxiety disorder, unspecified: Secondary | ICD-10-CM | POA: Diagnosis not present

## 2015-04-12 DIAGNOSIS — E7800 Pure hypercholesterolemia, unspecified: Secondary | ICD-10-CM | POA: Diagnosis not present

## 2015-04-12 DIAGNOSIS — J069 Acute upper respiratory infection, unspecified: Secondary | ICD-10-CM | POA: Diagnosis not present

## 2015-05-03 DIAGNOSIS — D485 Neoplasm of uncertain behavior of skin: Secondary | ICD-10-CM | POA: Diagnosis not present

## 2015-05-03 DIAGNOSIS — L089 Local infection of the skin and subcutaneous tissue, unspecified: Secondary | ICD-10-CM | POA: Diagnosis not present

## 2015-05-11 DIAGNOSIS — R102 Pelvic and perineal pain: Secondary | ICD-10-CM | POA: Diagnosis not present

## 2015-05-11 DIAGNOSIS — R300 Dysuria: Secondary | ICD-10-CM | POA: Diagnosis not present

## 2015-05-11 DIAGNOSIS — M810 Age-related osteoporosis without current pathological fracture: Secondary | ICD-10-CM | POA: Diagnosis not present

## 2015-05-11 DIAGNOSIS — Z681 Body mass index (BMI) 19 or less, adult: Secondary | ICD-10-CM | POA: Diagnosis not present

## 2015-05-11 DIAGNOSIS — F17290 Nicotine dependence, other tobacco product, uncomplicated: Secondary | ICD-10-CM | POA: Diagnosis not present

## 2015-05-11 DIAGNOSIS — Z86010 Personal history of colonic polyps: Secondary | ICD-10-CM | POA: Diagnosis not present

## 2015-05-11 DIAGNOSIS — Z01419 Encounter for gynecological examination (general) (routine) without abnormal findings: Secondary | ICD-10-CM | POA: Diagnosis not present

## 2015-05-19 DIAGNOSIS — K521 Toxic gastroenteritis and colitis: Secondary | ICD-10-CM | POA: Diagnosis not present

## 2015-06-14 DIAGNOSIS — M859 Disorder of bone density and structure, unspecified: Secondary | ICD-10-CM | POA: Diagnosis not present

## 2015-06-14 DIAGNOSIS — M810 Age-related osteoporosis without current pathological fracture: Secondary | ICD-10-CM | POA: Diagnosis not present

## 2015-06-14 DIAGNOSIS — M8580 Other specified disorders of bone density and structure, unspecified site: Secondary | ICD-10-CM | POA: Diagnosis not present

## 2015-07-25 ENCOUNTER — Other Ambulatory Visit: Payer: Self-pay | Admitting: Acute Care

## 2015-07-25 DIAGNOSIS — F17210 Nicotine dependence, cigarettes, uncomplicated: Secondary | ICD-10-CM

## 2015-09-27 DIAGNOSIS — D239 Other benign neoplasm of skin, unspecified: Secondary | ICD-10-CM | POA: Diagnosis not present

## 2015-09-27 DIAGNOSIS — L603 Nail dystrophy: Secondary | ICD-10-CM | POA: Diagnosis not present

## 2015-09-27 DIAGNOSIS — K130 Diseases of lips: Secondary | ICD-10-CM | POA: Diagnosis not present

## 2015-10-18 DIAGNOSIS — K521 Toxic gastroenteritis and colitis: Secondary | ICD-10-CM | POA: Diagnosis not present

## 2015-10-18 DIAGNOSIS — E042 Nontoxic multinodular goiter: Secondary | ICD-10-CM | POA: Diagnosis not present

## 2015-10-18 DIAGNOSIS — E785 Hyperlipidemia, unspecified: Secondary | ICD-10-CM | POA: Diagnosis not present

## 2015-10-18 DIAGNOSIS — F419 Anxiety disorder, unspecified: Secondary | ICD-10-CM | POA: Diagnosis not present

## 2015-10-18 DIAGNOSIS — J449 Chronic obstructive pulmonary disease, unspecified: Secondary | ICD-10-CM | POA: Diagnosis not present

## 2015-10-18 DIAGNOSIS — M5136 Other intervertebral disc degeneration, lumbar region: Secondary | ICD-10-CM | POA: Diagnosis not present

## 2015-10-18 DIAGNOSIS — M810 Age-related osteoporosis without current pathological fracture: Secondary | ICD-10-CM | POA: Diagnosis not present

## 2015-10-18 DIAGNOSIS — F17200 Nicotine dependence, unspecified, uncomplicated: Secondary | ICD-10-CM | POA: Diagnosis not present

## 2015-11-09 DIAGNOSIS — L290 Pruritus ani: Secondary | ICD-10-CM | POA: Diagnosis not present

## 2015-11-09 DIAGNOSIS — J309 Allergic rhinitis, unspecified: Secondary | ICD-10-CM | POA: Diagnosis not present

## 2015-11-09 DIAGNOSIS — K649 Unspecified hemorrhoids: Secondary | ICD-10-CM | POA: Diagnosis not present

## 2015-12-21 DIAGNOSIS — H25813 Combined forms of age-related cataract, bilateral: Secondary | ICD-10-CM | POA: Diagnosis not present

## 2015-12-21 DIAGNOSIS — H5203 Hypermetropia, bilateral: Secondary | ICD-10-CM | POA: Diagnosis not present

## 2015-12-21 DIAGNOSIS — H179 Unspecified corneal scar and opacity: Secondary | ICD-10-CM | POA: Diagnosis not present

## 2015-12-21 DIAGNOSIS — H43813 Vitreous degeneration, bilateral: Secondary | ICD-10-CM | POA: Diagnosis not present

## 2016-01-19 DIAGNOSIS — M5136 Other intervertebral disc degeneration, lumbar region: Secondary | ICD-10-CM | POA: Diagnosis not present

## 2016-01-19 DIAGNOSIS — F17200 Nicotine dependence, unspecified, uncomplicated: Secondary | ICD-10-CM | POA: Diagnosis not present

## 2016-01-19 DIAGNOSIS — K649 Unspecified hemorrhoids: Secondary | ICD-10-CM | POA: Diagnosis not present

## 2016-01-19 DIAGNOSIS — M19049 Primary osteoarthritis, unspecified hand: Secondary | ICD-10-CM | POA: Diagnosis not present

## 2016-01-19 DIAGNOSIS — J309 Allergic rhinitis, unspecified: Secondary | ICD-10-CM | POA: Diagnosis not present

## 2016-01-19 DIAGNOSIS — E042 Nontoxic multinodular goiter: Secondary | ICD-10-CM | POA: Diagnosis not present

## 2016-01-19 DIAGNOSIS — J449 Chronic obstructive pulmonary disease, unspecified: Secondary | ICD-10-CM | POA: Diagnosis not present

## 2016-01-19 DIAGNOSIS — M810 Age-related osteoporosis without current pathological fracture: Secondary | ICD-10-CM | POA: Diagnosis not present

## 2016-01-19 DIAGNOSIS — Z0000 Encounter for general adult medical examination without abnormal findings: Secondary | ICD-10-CM | POA: Diagnosis not present

## 2016-01-19 DIAGNOSIS — Z79899 Other long term (current) drug therapy: Secondary | ICD-10-CM | POA: Diagnosis not present

## 2016-01-19 DIAGNOSIS — L290 Pruritus ani: Secondary | ICD-10-CM | POA: Diagnosis not present

## 2016-01-19 DIAGNOSIS — F419 Anxiety disorder, unspecified: Secondary | ICD-10-CM | POA: Diagnosis not present

## 2016-01-19 DIAGNOSIS — Z23 Encounter for immunization: Secondary | ICD-10-CM | POA: Diagnosis not present

## 2016-01-19 DIAGNOSIS — Z1389 Encounter for screening for other disorder: Secondary | ICD-10-CM | POA: Diagnosis not present

## 2016-01-19 DIAGNOSIS — Z7189 Other specified counseling: Secondary | ICD-10-CM | POA: Diagnosis not present

## 2016-01-19 DIAGNOSIS — E785 Hyperlipidemia, unspecified: Secondary | ICD-10-CM | POA: Diagnosis not present

## 2016-01-19 DIAGNOSIS — N951 Menopausal and female climacteric states: Secondary | ICD-10-CM | POA: Diagnosis not present

## 2016-01-19 DIAGNOSIS — J3489 Other specified disorders of nose and nasal sinuses: Secondary | ICD-10-CM | POA: Diagnosis not present

## 2016-01-26 DIAGNOSIS — Z1231 Encounter for screening mammogram for malignant neoplasm of breast: Secondary | ICD-10-CM | POA: Diagnosis not present

## 2016-02-01 DIAGNOSIS — R0981 Nasal congestion: Secondary | ICD-10-CM | POA: Diagnosis not present

## 2016-02-01 DIAGNOSIS — N63 Unspecified lump in breast: Secondary | ICD-10-CM | POA: Diagnosis not present

## 2016-02-07 ENCOUNTER — Other Ambulatory Visit: Payer: Self-pay | Admitting: Radiology

## 2016-02-07 DIAGNOSIS — D242 Benign neoplasm of left breast: Secondary | ICD-10-CM | POA: Diagnosis not present

## 2016-02-07 DIAGNOSIS — N6320 Unspecified lump in the left breast, unspecified quadrant: Secondary | ICD-10-CM | POA: Diagnosis not present

## 2016-02-07 DIAGNOSIS — N6321 Unspecified lump in the left breast, upper outer quadrant: Secondary | ICD-10-CM | POA: Diagnosis not present

## 2016-02-14 ENCOUNTER — Ambulatory Visit (INDEPENDENT_AMBULATORY_CARE_PROVIDER_SITE_OTHER)
Admission: RE | Admit: 2016-02-14 | Discharge: 2016-02-14 | Disposition: A | Discharge status: Home / Self Care | Payer: Medicare Other | Source: Ambulatory Visit | Attending: Acute Care | Admitting: Acute Care

## 2016-02-14 DIAGNOSIS — F17210 Nicotine dependence, cigarettes, uncomplicated: Secondary | ICD-10-CM | POA: Diagnosis not present

## 2016-02-15 ENCOUNTER — Telehealth: Payer: Self-pay | Admitting: Acute Care

## 2016-02-15 NOTE — Telephone Encounter (Signed)
SG pt is calling about her results of the CT scan that was done yesterday.  Please advise. thanks

## 2016-02-16 ENCOUNTER — Other Ambulatory Visit: Payer: Self-pay | Admitting: Acute Care

## 2016-02-16 DIAGNOSIS — F17210 Nicotine dependence, cigarettes, uncomplicated: Secondary | ICD-10-CM

## 2016-02-16 NOTE — Telephone Encounter (Signed)
I have called Chloe Green with the results of her low dose screening CT. There was no answer, so I left a message on her cell phone. I explained that her scan was read as a Lung RADS 2: nodules that are benign in appearance and behavior with a very low likelihood of becoming a clinically active cancer due to size or lack of growth. Recommendation per radiology is for a repeat LDCT in 12 months.I explained that we will order her scan for 02/2017, and that she will get a call to schedule closer to the time.I have given her my contact number in the event she has any questions she would like answered regarding these results. I also let her know we would fax a copy of the scan to her PCP.

## 2016-04-05 DIAGNOSIS — M419 Scoliosis, unspecified: Secondary | ICD-10-CM | POA: Diagnosis not present

## 2016-04-05 DIAGNOSIS — M545 Low back pain: Secondary | ICD-10-CM | POA: Diagnosis not present

## 2016-05-02 ENCOUNTER — Other Ambulatory Visit: Payer: Self-pay | Admitting: Internal Medicine

## 2016-05-02 ENCOUNTER — Ambulatory Visit
Admission: RE | Admit: 2016-05-02 | Discharge: 2016-05-02 | Disposition: A | Discharge status: Home / Self Care | Payer: Medicare Other | Source: Ambulatory Visit | Attending: Internal Medicine | Admitting: Internal Medicine

## 2016-05-02 DIAGNOSIS — M545 Low back pain: Secondary | ICD-10-CM

## 2016-05-02 DIAGNOSIS — J449 Chronic obstructive pulmonary disease, unspecified: Secondary | ICD-10-CM | POA: Diagnosis not present

## 2016-05-02 DIAGNOSIS — M7580 Other shoulder lesions, unspecified shoulder: Secondary | ICD-10-CM | POA: Diagnosis not present

## 2016-05-02 DIAGNOSIS — M5136 Other intervertebral disc degeneration, lumbar region: Secondary | ICD-10-CM | POA: Diagnosis not present

## 2016-05-02 DIAGNOSIS — M419 Scoliosis, unspecified: Secondary | ICD-10-CM | POA: Diagnosis not present

## 2016-05-02 DIAGNOSIS — N951 Menopausal and female climacteric states: Secondary | ICD-10-CM | POA: Diagnosis not present

## 2016-05-30 DIAGNOSIS — M545 Low back pain: Secondary | ICD-10-CM | POA: Diagnosis not present

## 2016-05-30 DIAGNOSIS — N951 Menopausal and female climacteric states: Secondary | ICD-10-CM | POA: Diagnosis not present

## 2016-06-20 DIAGNOSIS — K219 Gastro-esophageal reflux disease without esophagitis: Secondary | ICD-10-CM | POA: Diagnosis not present

## 2016-06-26 DIAGNOSIS — M7541 Impingement syndrome of right shoulder: Secondary | ICD-10-CM | POA: Diagnosis not present

## 2016-07-19 DIAGNOSIS — M1812 Unilateral primary osteoarthritis of first carpometacarpal joint, left hand: Secondary | ICD-10-CM | POA: Diagnosis not present

## 2016-07-19 DIAGNOSIS — G5601 Carpal tunnel syndrome, right upper limb: Secondary | ICD-10-CM | POA: Diagnosis not present

## 2016-07-19 DIAGNOSIS — M1811 Unilateral primary osteoarthritis of first carpometacarpal joint, right hand: Secondary | ICD-10-CM | POA: Diagnosis not present

## 2016-07-19 DIAGNOSIS — G5602 Carpal tunnel syndrome, left upper limb: Secondary | ICD-10-CM | POA: Diagnosis not present

## 2016-07-29 DIAGNOSIS — G5602 Carpal tunnel syndrome, left upper limb: Secondary | ICD-10-CM | POA: Diagnosis not present

## 2016-07-29 DIAGNOSIS — G5601 Carpal tunnel syndrome, right upper limb: Secondary | ICD-10-CM | POA: Diagnosis not present

## 2016-08-15 DIAGNOSIS — M1812 Unilateral primary osteoarthritis of first carpometacarpal joint, left hand: Secondary | ICD-10-CM | POA: Diagnosis not present

## 2016-08-15 DIAGNOSIS — G5602 Carpal tunnel syndrome, left upper limb: Secondary | ICD-10-CM | POA: Diagnosis not present

## 2016-08-15 DIAGNOSIS — G5601 Carpal tunnel syndrome, right upper limb: Secondary | ICD-10-CM | POA: Diagnosis not present

## 2016-08-15 DIAGNOSIS — M1811 Unilateral primary osteoarthritis of first carpometacarpal joint, right hand: Secondary | ICD-10-CM | POA: Diagnosis not present

## 2016-08-29 DIAGNOSIS — R0981 Nasal congestion: Secondary | ICD-10-CM | POA: Diagnosis not present

## 2016-08-29 DIAGNOSIS — J309 Allergic rhinitis, unspecified: Secondary | ICD-10-CM | POA: Diagnosis not present

## 2016-08-29 DIAGNOSIS — R51 Headache: Secondary | ICD-10-CM | POA: Diagnosis not present

## 2016-08-29 DIAGNOSIS — J441 Chronic obstructive pulmonary disease with (acute) exacerbation: Secondary | ICD-10-CM | POA: Diagnosis not present

## 2016-09-04 DIAGNOSIS — D492 Neoplasm of unspecified behavior of bone, soft tissue, and skin: Secondary | ICD-10-CM | POA: Diagnosis not present

## 2016-09-04 DIAGNOSIS — D229 Melanocytic nevi, unspecified: Secondary | ICD-10-CM | POA: Diagnosis not present

## 2016-09-04 DIAGNOSIS — L821 Other seborrheic keratosis: Secondary | ICD-10-CM | POA: Diagnosis not present

## 2016-09-19 DIAGNOSIS — N6321 Unspecified lump in the left breast, upper outer quadrant: Secondary | ICD-10-CM | POA: Diagnosis not present

## 2016-09-19 DIAGNOSIS — Z09 Encounter for follow-up examination after completed treatment for conditions other than malignant neoplasm: Secondary | ICD-10-CM | POA: Diagnosis not present

## 2016-09-19 DIAGNOSIS — N6489 Other specified disorders of breast: Secondary | ICD-10-CM | POA: Diagnosis not present

## 2016-09-26 DIAGNOSIS — R140 Abdominal distension (gaseous): Secondary | ICD-10-CM | POA: Diagnosis not present

## 2016-09-26 DIAGNOSIS — Z1211 Encounter for screening for malignant neoplasm of colon: Secondary | ICD-10-CM | POA: Diagnosis not present

## 2016-09-26 DIAGNOSIS — K219 Gastro-esophageal reflux disease without esophagitis: Secondary | ICD-10-CM | POA: Diagnosis not present

## 2016-09-26 DIAGNOSIS — Z86010 Personal history of colonic polyps: Secondary | ICD-10-CM | POA: Diagnosis not present

## 2016-09-26 DIAGNOSIS — K5730 Diverticulosis of large intestine without perforation or abscess without bleeding: Secondary | ICD-10-CM | POA: Diagnosis not present

## 2016-10-03 DIAGNOSIS — M1811 Unilateral primary osteoarthritis of first carpometacarpal joint, right hand: Secondary | ICD-10-CM | POA: Diagnosis not present

## 2016-10-03 DIAGNOSIS — G5601 Carpal tunnel syndrome, right upper limb: Secondary | ICD-10-CM | POA: Diagnosis not present

## 2016-10-03 DIAGNOSIS — M1812 Unilateral primary osteoarthritis of first carpometacarpal joint, left hand: Secondary | ICD-10-CM | POA: Diagnosis not present

## 2016-10-03 DIAGNOSIS — G5602 Carpal tunnel syndrome, left upper limb: Secondary | ICD-10-CM | POA: Diagnosis not present

## 2016-11-13 DIAGNOSIS — K635 Polyp of colon: Secondary | ICD-10-CM | POA: Diagnosis not present

## 2016-11-13 DIAGNOSIS — K5730 Diverticulosis of large intestine without perforation or abscess without bleeding: Secondary | ICD-10-CM | POA: Diagnosis not present

## 2016-12-25 DIAGNOSIS — L03116 Cellulitis of left lower limb: Secondary | ICD-10-CM | POA: Diagnosis not present

## 2017-01-24 DIAGNOSIS — M19049 Primary osteoarthritis, unspecified hand: Secondary | ICD-10-CM | POA: Diagnosis not present

## 2017-01-24 DIAGNOSIS — Z23 Encounter for immunization: Secondary | ICD-10-CM | POA: Diagnosis not present

## 2017-01-24 DIAGNOSIS — Z1389 Encounter for screening for other disorder: Secondary | ICD-10-CM | POA: Diagnosis not present

## 2017-01-24 DIAGNOSIS — M810 Age-related osteoporosis without current pathological fracture: Secondary | ICD-10-CM | POA: Diagnosis not present

## 2017-01-24 DIAGNOSIS — J449 Chronic obstructive pulmonary disease, unspecified: Secondary | ICD-10-CM | POA: Diagnosis not present

## 2017-01-24 DIAGNOSIS — R51 Headache: Secondary | ICD-10-CM | POA: Diagnosis not present

## 2017-01-24 DIAGNOSIS — E785 Hyperlipidemia, unspecified: Secondary | ICD-10-CM | POA: Diagnosis not present

## 2017-01-24 DIAGNOSIS — F17200 Nicotine dependence, unspecified, uncomplicated: Secondary | ICD-10-CM | POA: Diagnosis not present

## 2017-01-24 DIAGNOSIS — Z79899 Other long term (current) drug therapy: Secondary | ICD-10-CM | POA: Diagnosis not present

## 2017-01-24 DIAGNOSIS — E042 Nontoxic multinodular goiter: Secondary | ICD-10-CM | POA: Diagnosis not present

## 2017-01-24 DIAGNOSIS — K219 Gastro-esophageal reflux disease without esophagitis: Secondary | ICD-10-CM | POA: Diagnosis not present

## 2017-01-24 DIAGNOSIS — K635 Polyp of colon: Secondary | ICD-10-CM | POA: Diagnosis not present

## 2017-01-24 DIAGNOSIS — E559 Vitamin D deficiency, unspecified: Secondary | ICD-10-CM | POA: Diagnosis not present

## 2017-01-24 DIAGNOSIS — Z0000 Encounter for general adult medical examination without abnormal findings: Secondary | ICD-10-CM | POA: Diagnosis not present

## 2017-01-24 DIAGNOSIS — K5730 Diverticulosis of large intestine without perforation or abscess without bleeding: Secondary | ICD-10-CM | POA: Diagnosis not present

## 2017-02-14 ENCOUNTER — Ambulatory Visit (INDEPENDENT_AMBULATORY_CARE_PROVIDER_SITE_OTHER)
Admission: RE | Admit: 2017-02-14 | Discharge: 2017-02-14 | Disposition: A | Discharge status: Home / Self Care | Payer: Medicare Other | Source: Ambulatory Visit | Attending: Acute Care | Admitting: Acute Care

## 2017-02-14 DIAGNOSIS — Z87891 Personal history of nicotine dependence: Secondary | ICD-10-CM

## 2017-02-14 DIAGNOSIS — F17210 Nicotine dependence, cigarettes, uncomplicated: Secondary | ICD-10-CM

## 2017-02-17 ENCOUNTER — Other Ambulatory Visit: Payer: Self-pay | Admitting: Acute Care

## 2017-02-17 DIAGNOSIS — Z122 Encounter for screening for malignant neoplasm of respiratory organs: Secondary | ICD-10-CM

## 2017-02-17 DIAGNOSIS — F17210 Nicotine dependence, cigarettes, uncomplicated: Secondary | ICD-10-CM

## 2017-03-05 DIAGNOSIS — M7541 Impingement syndrome of right shoulder: Secondary | ICD-10-CM | POA: Diagnosis not present

## 2017-03-20 DIAGNOSIS — Z1231 Encounter for screening mammogram for malignant neoplasm of breast: Secondary | ICD-10-CM | POA: Diagnosis not present

## 2017-04-04 DIAGNOSIS — H5203 Hypermetropia, bilateral: Secondary | ICD-10-CM | POA: Diagnosis not present

## 2017-04-04 DIAGNOSIS — H0100A Unspecified blepharitis right eye, upper and lower eyelids: Secondary | ICD-10-CM | POA: Diagnosis not present

## 2017-04-04 DIAGNOSIS — H40013 Open angle with borderline findings, low risk, bilateral: Secondary | ICD-10-CM | POA: Diagnosis not present

## 2017-04-04 DIAGNOSIS — H2513 Age-related nuclear cataract, bilateral: Secondary | ICD-10-CM | POA: Diagnosis not present

## 2017-04-17 DIAGNOSIS — H33302 Unspecified retinal break, left eye: Secondary | ICD-10-CM | POA: Diagnosis not present

## 2017-04-17 DIAGNOSIS — H43813 Vitreous degeneration, bilateral: Secondary | ICD-10-CM | POA: Diagnosis not present

## 2017-07-03 DIAGNOSIS — T148XXA Other injury of unspecified body region, initial encounter: Secondary | ICD-10-CM | POA: Diagnosis not present

## 2017-07-03 DIAGNOSIS — M545 Low back pain: Secondary | ICD-10-CM | POA: Diagnosis not present

## 2017-07-03 DIAGNOSIS — L03115 Cellulitis of right lower limb: Secondary | ICD-10-CM | POA: Diagnosis not present

## 2017-07-03 DIAGNOSIS — L03116 Cellulitis of left lower limb: Secondary | ICD-10-CM | POA: Diagnosis not present

## 2017-07-16 DIAGNOSIS — H43813 Vitreous degeneration, bilateral: Secondary | ICD-10-CM | POA: Diagnosis not present

## 2017-07-16 DIAGNOSIS — H33302 Unspecified retinal break, left eye: Secondary | ICD-10-CM | POA: Diagnosis not present

## 2017-08-06 DIAGNOSIS — H04123 Dry eye syndrome of bilateral lacrimal glands: Secondary | ICD-10-CM | POA: Diagnosis not present

## 2017-08-06 DIAGNOSIS — H2513 Age-related nuclear cataract, bilateral: Secondary | ICD-10-CM | POA: Diagnosis not present

## 2017-08-06 DIAGNOSIS — H11823 Conjunctivochalasis, bilateral: Secondary | ICD-10-CM | POA: Diagnosis not present

## 2017-08-21 ENCOUNTER — Ambulatory Visit (INDEPENDENT_AMBULATORY_CARE_PROVIDER_SITE_OTHER): Payer: Medicare Other | Admitting: Orthopaedic Surgery

## 2017-08-21 ENCOUNTER — Ambulatory Visit (INDEPENDENT_AMBULATORY_CARE_PROVIDER_SITE_OTHER): Payer: Medicare Other

## 2017-08-21 ENCOUNTER — Encounter (INDEPENDENT_AMBULATORY_CARE_PROVIDER_SITE_OTHER): Payer: Self-pay | Admitting: Orthopaedic Surgery

## 2017-08-21 DIAGNOSIS — M1711 Unilateral primary osteoarthritis, right knee: Secondary | ICD-10-CM | POA: Diagnosis not present

## 2017-08-21 DIAGNOSIS — M25561 Pain in right knee: Secondary | ICD-10-CM

## 2017-08-21 DIAGNOSIS — M1712 Unilateral primary osteoarthritis, left knee: Secondary | ICD-10-CM | POA: Diagnosis not present

## 2017-08-21 DIAGNOSIS — M25562 Pain in left knee: Secondary | ICD-10-CM

## 2017-08-21 DIAGNOSIS — G8929 Other chronic pain: Secondary | ICD-10-CM | POA: Diagnosis not present

## 2017-08-21 NOTE — Progress Notes (Signed)
Office Visit Note   Patient: Chloe Green           Date of Birth: 06-Apr-1946           MRN: [id number] Visit Date: 08/21/2017              Requested by: Josetta Huddle, MD [address] [place], [place] [address] PCP: Josetta Huddle, MD   Assessment & Plan: Visit Diagnoses:  1. Chronic pain of both knees   2. Unilateral primary osteoarthritis, left knee   3. Unilateral primary osteoarthritis, right knee     Plan: Given the pain in her knees with a mild arthritis she is a perfect candidate for steroid injections followed by hyaluronic acid injections 4 weeks from now.  I talked about this treatment plan and we went over her x-rays in detail.  I showed her knee model explained the rationale behind our treatment.  She is to work on quad strengthening exercises as well.  The risk and benefits of steroid injections were explained to her in detail and she tolerated these well.  We will see her back in 4 weeks to provide hyaluronic acid with Monovisc in both knees.  I gave her handout on this as well.  Follow-Up Instructions: Return in about 1 month (around 09/18/2017).   Orders:  Orders Placed This Encounter  Procedures  . Large Joint Inj  . XR Knee 1-2 Views Left  . XR Knee 1-2 Views Right   No orders of the defined types were placed in this encounter.     Procedures: Large Joint Inj: bilateral knee on 08/21/2017 2:40 PM Indications: diagnostic evaluation and pain Details: 22 G 1.5 in needle, superolateral approach  Arthrogram: No  Outcome: tolerated well, no immediate complications Procedure, treatment alternatives, risks and benefits explained, specific risks discussed. Consent was given by the patient. Immediately prior to procedure a time out was called to verify the correct patient, procedure, equipment, support staff and site/side marked as required. Patient was prepped and draped in the usual sterile fashion.       Clinical Data: No additional  findings.   Subjective: Chief Complaint  Patient presents with  . Right Knee - Pain  . Left Knee - Pain  Patient somewhat I am seeing for the first time.  She comes in with bilateral knee pain with left worse than right is not a constant pain but she gets flareups of pain with no locking catching.  Is going on for many years now.  She is not obese individual.  She denies any instability with her knees.  She is very active 72 years old.  HPI  Review of Systems She currently denies any headache, chest pain, shortness of breath, fever, chills, nausea, vomiting.  Objective: Vital Signs: There were no vitals taken for this visit.  Physical Exam She is alert and oriented x3 and in no acute distress Ortho Exam Examination of both knees shows no effusion.  She has just mild patellofemoral crepitation with good range of motion of both knees.  Both knees are ligamentously stable with no significant joint line tenderness. Specialty Comments:  No specialty comments available.  Imaging: Xr Knee 1-2 Views Left  Result Date: 08/21/2017 2 views of the left knee show mild patellofemoral chondromalacia but otherwise good alignment and well-maintained joint spaces with no acute findings.  Xr Knee 1-2 Views Right  Result Date: 08/21/2017 2 views of the right knee show no acute findings.  There is mild  patellofemoral arthritic changes with overall good alignment and well-maintained medial lateral compartments.    PMFS History: Patient Active Problem List   Diagnosis Date Noted  . Chronic pain of both knees 08/21/2017  . Unilateral primary osteoarthritis, left knee 08/21/2017  . Unilateral primary osteoarthritis, right knee 08/21/2017   Past Medical History:  Diagnosis Date  . COPD (chronic obstructive pulmonary disease) ([place])   . Degenerative disc disease, lumbar   . Diverticulosis   . Hypercholesteremia   . Mitral valve prolapse   . Panic attacks     History reviewed. No pertinent  family history.  Past Surgical History:  Procedure Laterality Date  . ABDOMINAL HYSTERECTOMY     Social History   Occupational History  . Not on file  Tobacco Use  . Smoking status: Current Every Day Smoker    Packs/day: 1.00    Years: 45.00    Pack years: 45.00    Types: Cigarettes  Substance and Sexual Activity  . Alcohol use: No    Alcohol/week: 0.0 oz  . Drug use: No  . Sexual activity: Not on file

## 2017-09-17 DIAGNOSIS — H524 Presbyopia: Secondary | ICD-10-CM | POA: Diagnosis not present

## 2017-09-17 DIAGNOSIS — H40013 Open angle with borderline findings, low risk, bilateral: Secondary | ICD-10-CM | POA: Diagnosis not present

## 2017-09-17 DIAGNOSIS — H11823 Conjunctivochalasis, bilateral: Secondary | ICD-10-CM | POA: Diagnosis not present

## 2017-09-17 DIAGNOSIS — H2513 Age-related nuclear cataract, bilateral: Secondary | ICD-10-CM | POA: Diagnosis not present

## 2017-09-24 ENCOUNTER — Ambulatory Visit (INDEPENDENT_AMBULATORY_CARE_PROVIDER_SITE_OTHER): Payer: Medicare Other | Admitting: Orthopaedic Surgery

## 2017-09-24 ENCOUNTER — Encounter (INDEPENDENT_AMBULATORY_CARE_PROVIDER_SITE_OTHER): Payer: Self-pay | Admitting: Orthopaedic Surgery

## 2017-09-24 DIAGNOSIS — M1711 Unilateral primary osteoarthritis, right knee: Secondary | ICD-10-CM | POA: Diagnosis not present

## 2017-09-24 DIAGNOSIS — M1712 Unilateral primary osteoarthritis, left knee: Secondary | ICD-10-CM

## 2017-09-24 MED ORDER — HYALURONAN 88 MG/4ML IX SOSY
88.0000 mg | PREFILLED_SYRINGE | INTRA_ARTICULAR | Status: AC | PRN
  Administered 2017-09-24: 88 mg via INTRA_ARTICULAR

## 2017-09-24 NOTE — Progress Notes (Signed)
   Procedure Note  Patient: Chloe Green             Date of Birth: 08/25/1945           MRN: [id number]             Visit Date: 09/24/2017  Procedures: Visit Diagnoses: No diagnosis found.  Large Joint Inj: R knee on 09/24/2017 1:14 PM Indications: diagnostic evaluation and pain Details: 22 G 1.5 in needle, superolateral approach  Arthrogram: No  Medications: 88 mg Hyaluronan 88 MG/4ML Outcome: tolerated well, no immediate complications Procedure, treatment alternatives, risks and benefits explained, specific risks discussed. Consent was given by the patient. Immediately prior to procedure a time out was called to verify the correct patient, procedure, equipment, support staff and site/side marked as required. Patient was prepped and draped in the usual sterile fashion.   Large Joint Inj: L knee on 09/24/2017 1:15 PM Indications: diagnostic evaluation and pain Details: 22 G 1.5 in needle, superolateral approach  Arthrogram: No  Medications: 88 mg Hyaluronan 88 MG/4ML Outcome: tolerated well, no immediate complications Procedure, treatment alternatives, risks and benefits explained, specific risks discussed. Consent was given by the patient. Immediately prior to procedure a time out was called to verify the correct patient, procedure, equipment, support staff and site/side marked as required. Patient was prepped and draped in the usual sterile fashion.     The patient is coming in today for scheduled hyaluronic acid injection in both knees with Monovisc.  She has arthritic pain in both knees and well-documented arthritis of both knees.  She tried steroid injections as that this did not help at all.  Her left knee hurts her much worse than her right knee.  This is more of a gnawing type of pain.  On exam either knee has an effusion today.  I was able to put Monovisc in both knees without any significant issues.  All questions were addressed and concerns were addressed.  Follow-up  will be as needed.

## 2017-10-09 DIAGNOSIS — F17210 Nicotine dependence, cigarettes, uncomplicated: Secondary | ICD-10-CM | POA: Diagnosis not present

## 2017-10-09 DIAGNOSIS — Z01419 Encounter for gynecological examination (general) (routine) without abnormal findings: Secondary | ICD-10-CM | POA: Diagnosis not present

## 2017-10-09 DIAGNOSIS — Z681 Body mass index (BMI) 19 or less, adult: Secondary | ICD-10-CM | POA: Diagnosis not present

## 2017-10-16 DIAGNOSIS — M1811 Unilateral primary osteoarthritis of first carpometacarpal joint, right hand: Secondary | ICD-10-CM | POA: Diagnosis not present

## 2017-10-16 DIAGNOSIS — G5603 Carpal tunnel syndrome, bilateral upper limbs: Secondary | ICD-10-CM | POA: Diagnosis not present

## 2017-10-16 DIAGNOSIS — M7551 Bursitis of right shoulder: Secondary | ICD-10-CM | POA: Diagnosis not present

## 2017-10-16 DIAGNOSIS — M7541 Impingement syndrome of right shoulder: Secondary | ICD-10-CM | POA: Diagnosis not present

## 2017-10-29 DIAGNOSIS — H11823 Conjunctivochalasis, bilateral: Secondary | ICD-10-CM | POA: Diagnosis not present

## 2017-10-30 DIAGNOSIS — H11823 Conjunctivochalasis, bilateral: Secondary | ICD-10-CM | POA: Diagnosis not present

## 2017-11-13 DIAGNOSIS — M8588 Other specified disorders of bone density and structure, other site: Secondary | ICD-10-CM | POA: Diagnosis not present

## 2017-11-20 DIAGNOSIS — M180 Bilateral primary osteoarthritis of first carpometacarpal joints: Secondary | ICD-10-CM | POA: Diagnosis not present

## 2017-12-17 DIAGNOSIS — K5790 Diverticulosis of intestine, part unspecified, without perforation or abscess without bleeding: Secondary | ICD-10-CM | POA: Diagnosis not present

## 2017-12-17 DIAGNOSIS — R140 Abdominal distension (gaseous): Secondary | ICD-10-CM | POA: Diagnosis not present

## 2017-12-18 DIAGNOSIS — M25511 Pain in right shoulder: Secondary | ICD-10-CM | POA: Diagnosis not present

## 2017-12-18 DIAGNOSIS — M180 Bilateral primary osteoarthritis of first carpometacarpal joints: Secondary | ICD-10-CM | POA: Diagnosis not present

## 2018-01-14 DIAGNOSIS — R202 Paresthesia of skin: Secondary | ICD-10-CM | POA: Diagnosis not present

## 2018-01-14 DIAGNOSIS — L820 Inflamed seborrheic keratosis: Secondary | ICD-10-CM | POA: Diagnosis not present

## 2018-01-14 DIAGNOSIS — D485 Neoplasm of uncertain behavior of skin: Secondary | ICD-10-CM | POA: Diagnosis not present

## 2018-01-14 DIAGNOSIS — D692 Other nonthrombocytopenic purpura: Secondary | ICD-10-CM | POA: Diagnosis not present

## 2018-01-21 DIAGNOSIS — H33302 Unspecified retinal break, left eye: Secondary | ICD-10-CM | POA: Diagnosis not present

## 2018-01-21 DIAGNOSIS — H43813 Vitreous degeneration, bilateral: Secondary | ICD-10-CM | POA: Diagnosis not present

## 2018-01-30 DIAGNOSIS — Z23 Encounter for immunization: Secondary | ICD-10-CM | POA: Diagnosis not present

## 2018-02-04 ENCOUNTER — Other Ambulatory Visit (HOSPITAL_COMMUNITY): Payer: Self-pay | Admitting: Internal Medicine

## 2018-02-04 DIAGNOSIS — N951 Menopausal and female climacteric states: Secondary | ICD-10-CM | POA: Diagnosis not present

## 2018-02-04 DIAGNOSIS — M2550 Pain in unspecified joint: Secondary | ICD-10-CM | POA: Diagnosis not present

## 2018-02-04 DIAGNOSIS — K219 Gastro-esophageal reflux disease without esophagitis: Secondary | ICD-10-CM | POA: Diagnosis not present

## 2018-02-04 DIAGNOSIS — I341 Nonrheumatic mitral (valve) prolapse: Secondary | ICD-10-CM | POA: Diagnosis not present

## 2018-02-04 DIAGNOSIS — Z0000 Encounter for general adult medical examination without abnormal findings: Secondary | ICD-10-CM | POA: Diagnosis not present

## 2018-02-04 DIAGNOSIS — F419 Anxiety disorder, unspecified: Secondary | ICD-10-CM | POA: Diagnosis not present

## 2018-02-04 DIAGNOSIS — M545 Low back pain: Secondary | ICD-10-CM | POA: Diagnosis not present

## 2018-02-04 DIAGNOSIS — M419 Scoliosis, unspecified: Secondary | ICD-10-CM | POA: Diagnosis not present

## 2018-02-04 DIAGNOSIS — J449 Chronic obstructive pulmonary disease, unspecified: Secondary | ICD-10-CM | POA: Diagnosis not present

## 2018-02-04 DIAGNOSIS — F17200 Nicotine dependence, unspecified, uncomplicated: Secondary | ICD-10-CM | POA: Diagnosis not present

## 2018-02-04 DIAGNOSIS — R140 Abdominal distension (gaseous): Secondary | ICD-10-CM | POA: Diagnosis not present

## 2018-02-04 DIAGNOSIS — E559 Vitamin D deficiency, unspecified: Secondary | ICD-10-CM | POA: Diagnosis not present

## 2018-02-11 DIAGNOSIS — M2550 Pain in unspecified joint: Secondary | ICD-10-CM | POA: Diagnosis not present

## 2018-02-11 DIAGNOSIS — M1990 Unspecified osteoarthritis, unspecified site: Secondary | ICD-10-CM | POA: Diagnosis not present

## 2018-02-11 DIAGNOSIS — J449 Chronic obstructive pulmonary disease, unspecified: Secondary | ICD-10-CM | POA: Diagnosis not present

## 2018-02-11 DIAGNOSIS — M545 Low back pain: Secondary | ICD-10-CM | POA: Diagnosis not present

## 2018-02-11 DIAGNOSIS — M064 Inflammatory polyarthropathy: Secondary | ICD-10-CM | POA: Diagnosis not present

## 2018-02-11 DIAGNOSIS — F17200 Nicotine dependence, unspecified, uncomplicated: Secondary | ICD-10-CM | POA: Diagnosis not present

## 2018-02-11 DIAGNOSIS — M549 Dorsalgia, unspecified: Secondary | ICD-10-CM | POA: Diagnosis not present

## 2018-02-18 DIAGNOSIS — K5790 Diverticulosis of intestine, part unspecified, without perforation or abscess without bleeding: Secondary | ICD-10-CM | POA: Diagnosis not present

## 2018-02-25 ENCOUNTER — Ambulatory Visit (HOSPITAL_COMMUNITY): Payer: Medicare Other | Attending: Cardiology

## 2018-02-25 ENCOUNTER — Other Ambulatory Visit: Payer: Self-pay

## 2018-02-25 ENCOUNTER — Ambulatory Visit (INDEPENDENT_AMBULATORY_CARE_PROVIDER_SITE_OTHER)
Admission: RE | Admit: 2018-02-25 | Discharge: 2018-02-25 | Disposition: A | Discharge status: Home / Self Care | Payer: Medicare Other | Source: Ambulatory Visit | Attending: Acute Care | Admitting: Acute Care

## 2018-02-25 DIAGNOSIS — F17210 Nicotine dependence, cigarettes, uncomplicated: Secondary | ICD-10-CM

## 2018-02-25 DIAGNOSIS — I341 Nonrheumatic mitral (valve) prolapse: Secondary | ICD-10-CM

## 2018-02-25 DIAGNOSIS — Z122 Encounter for screening for malignant neoplasm of respiratory organs: Secondary | ICD-10-CM | POA: Diagnosis not present

## 2018-03-02 ENCOUNTER — Telehealth: Payer: Self-pay | Admitting: Acute Care

## 2018-03-02 DIAGNOSIS — F17210 Nicotine dependence, cigarettes, uncomplicated: Secondary | ICD-10-CM

## 2018-03-02 DIAGNOSIS — Z87891 Personal history of nicotine dependence: Secondary | ICD-10-CM

## 2018-03-02 DIAGNOSIS — Z122 Encounter for screening for malignant neoplasm of respiratory organs: Secondary | ICD-10-CM

## 2018-03-02 NOTE — Telephone Encounter (Signed)
Pt informed of CT results per Sarah Groce, NP.  PT verbalized understanding.  Copy sent to PCP.  Order placed for 1 yr f/u CT.  

## 2018-03-02 NOTE — Telephone Encounter (Signed)
LMTC x 1  

## 2018-03-11 DIAGNOSIS — L603 Nail dystrophy: Secondary | ICD-10-CM | POA: Diagnosis not present

## 2018-03-11 DIAGNOSIS — L608 Other nail disorders: Secondary | ICD-10-CM | POA: Diagnosis not present

## 2018-03-25 DIAGNOSIS — Z1231 Encounter for screening mammogram for malignant neoplasm of breast: Secondary | ICD-10-CM | POA: Diagnosis not present

## 2018-04-01 DIAGNOSIS — H04123 Dry eye syndrome of bilateral lacrimal glands: Secondary | ICD-10-CM | POA: Diagnosis not present

## 2018-04-01 DIAGNOSIS — H2513 Age-related nuclear cataract, bilateral: Secondary | ICD-10-CM | POA: Diagnosis not present

## 2018-04-01 DIAGNOSIS — H52203 Unspecified astigmatism, bilateral: Secondary | ICD-10-CM | POA: Diagnosis not present

## 2018-04-01 DIAGNOSIS — H40013 Open angle with borderline findings, low risk, bilateral: Secondary | ICD-10-CM | POA: Diagnosis not present

## 2018-04-30 DIAGNOSIS — M545 Low back pain: Secondary | ICD-10-CM | POA: Diagnosis not present

## 2018-05-13 DIAGNOSIS — Z79890 Hormone replacement therapy (postmenopausal): Secondary | ICD-10-CM | POA: Diagnosis not present

## 2018-05-13 DIAGNOSIS — N9089 Other specified noninflammatory disorders of vulva and perineum: Secondary | ICD-10-CM | POA: Diagnosis not present

## 2018-05-15 DIAGNOSIS — R1909 Other intra-abdominal and pelvic swelling, mass and lump: Secondary | ICD-10-CM | POA: Diagnosis not present

## 2018-05-21 DIAGNOSIS — M4306 Spondylolysis, lumbar region: Secondary | ICD-10-CM | POA: Diagnosis not present

## 2018-05-27 DIAGNOSIS — M4306 Spondylolysis, lumbar region: Secondary | ICD-10-CM | POA: Diagnosis not present

## 2018-05-28 DIAGNOSIS — M4306 Spondylolysis, lumbar region: Secondary | ICD-10-CM | POA: Diagnosis not present

## 2018-06-03 DIAGNOSIS — M4306 Spondylolysis, lumbar region: Secondary | ICD-10-CM | POA: Diagnosis not present

## 2018-06-04 DIAGNOSIS — H6981 Other specified disorders of Eustachian tube, right ear: Secondary | ICD-10-CM | POA: Diagnosis not present

## 2018-06-04 DIAGNOSIS — R0981 Nasal congestion: Secondary | ICD-10-CM | POA: Diagnosis not present

## 2018-06-05 DIAGNOSIS — M4306 Spondylolysis, lumbar region: Secondary | ICD-10-CM | POA: Diagnosis not present

## 2018-06-11 DIAGNOSIS — D171 Benign lipomatous neoplasm of skin and subcutaneous tissue of trunk: Secondary | ICD-10-CM | POA: Diagnosis not present

## 2018-06-24 DIAGNOSIS — H35371 Puckering of macula, right eye: Secondary | ICD-10-CM | POA: Diagnosis not present

## 2018-06-24 DIAGNOSIS — H3122 Choroidal dystrophy (central areolar) (generalized) (peripapillary): Secondary | ICD-10-CM | POA: Diagnosis not present

## 2018-06-24 DIAGNOSIS — H35372 Puckering of macula, left eye: Secondary | ICD-10-CM | POA: Diagnosis not present

## 2018-06-24 DIAGNOSIS — H25813 Combined forms of age-related cataract, bilateral: Secondary | ICD-10-CM | POA: Diagnosis not present

## 2018-06-24 DIAGNOSIS — H31092 Other chorioretinal scars, left eye: Secondary | ICD-10-CM | POA: Diagnosis not present

## 2018-07-16 DIAGNOSIS — M4306 Spondylolysis, lumbar region: Secondary | ICD-10-CM | POA: Diagnosis not present

## 2018-10-15 DIAGNOSIS — J343 Hypertrophy of nasal turbinates: Secondary | ICD-10-CM | POA: Diagnosis not present

## 2018-10-15 DIAGNOSIS — H9203 Otalgia, bilateral: Secondary | ICD-10-CM | POA: Diagnosis not present

## 2018-10-15 DIAGNOSIS — M26623 Arthralgia of bilateral temporomandibular joint: Secondary | ICD-10-CM | POA: Diagnosis not present

## 2018-10-15 DIAGNOSIS — R51 Headache: Secondary | ICD-10-CM | POA: Diagnosis not present

## 2018-10-21 DIAGNOSIS — R51 Headache: Secondary | ICD-10-CM | POA: Diagnosis not present

## 2018-10-21 DIAGNOSIS — J329 Chronic sinusitis, unspecified: Secondary | ICD-10-CM | POA: Diagnosis not present

## 2019-01-14 DIAGNOSIS — Z23 Encounter for immunization: Secondary | ICD-10-CM | POA: Diagnosis not present

## 2019-02-11 DIAGNOSIS — N951 Menopausal and female climacteric states: Secondary | ICD-10-CM | POA: Diagnosis not present

## 2019-02-11 DIAGNOSIS — Z0001 Encounter for general adult medical examination with abnormal findings: Secondary | ICD-10-CM | POA: Diagnosis not present

## 2019-02-11 DIAGNOSIS — J449 Chronic obstructive pulmonary disease, unspecified: Secondary | ICD-10-CM | POA: Diagnosis not present

## 2019-02-11 DIAGNOSIS — M419 Scoliosis, unspecified: Secondary | ICD-10-CM | POA: Diagnosis not present

## 2019-02-11 DIAGNOSIS — F419 Anxiety disorder, unspecified: Secondary | ICD-10-CM | POA: Diagnosis not present

## 2019-02-11 DIAGNOSIS — Z79899 Other long term (current) drug therapy: Secondary | ICD-10-CM | POA: Diagnosis not present

## 2019-02-11 DIAGNOSIS — E785 Hyperlipidemia, unspecified: Secondary | ICD-10-CM | POA: Diagnosis not present

## 2019-02-11 DIAGNOSIS — K219 Gastro-esophageal reflux disease without esophagitis: Secondary | ICD-10-CM | POA: Diagnosis not present

## 2019-02-11 DIAGNOSIS — E559 Vitamin D deficiency, unspecified: Secondary | ICD-10-CM | POA: Diagnosis not present

## 2019-02-11 DIAGNOSIS — K5790 Diverticulosis of intestine, part unspecified, without perforation or abscess without bleeding: Secondary | ICD-10-CM | POA: Diagnosis not present

## 2019-02-11 DIAGNOSIS — F17200 Nicotine dependence, unspecified, uncomplicated: Secondary | ICD-10-CM | POA: Diagnosis not present

## 2019-02-11 DIAGNOSIS — Z1389 Encounter for screening for other disorder: Secondary | ICD-10-CM | POA: Diagnosis not present

## 2019-02-24 DIAGNOSIS — D229 Melanocytic nevi, unspecified: Secondary | ICD-10-CM | POA: Diagnosis not present

## 2019-02-24 DIAGNOSIS — R300 Dysuria: Secondary | ICD-10-CM | POA: Diagnosis not present

## 2019-02-24 DIAGNOSIS — L821 Other seborrheic keratosis: Secondary | ICD-10-CM | POA: Diagnosis not present

## 2019-02-24 DIAGNOSIS — L570 Actinic keratosis: Secondary | ICD-10-CM | POA: Diagnosis not present

## 2019-03-10 ENCOUNTER — Telehealth: Payer: Self-pay | Admitting: Acute Care

## 2019-03-10 DIAGNOSIS — F17210 Nicotine dependence, cigarettes, uncomplicated: Secondary | ICD-10-CM

## 2019-03-10 DIAGNOSIS — Z122 Encounter for screening for malignant neoplasm of respiratory organs: Secondary | ICD-10-CM

## 2019-03-10 DIAGNOSIS — Z87891 Personal history of nicotine dependence: Secondary | ICD-10-CM

## 2019-03-10 NOTE — Telephone Encounter (Signed)
L[facility] Gauss we will still need a new CT order to schedule for the patient. I have sent you another staff message just in case you didn't get [facility]

## 2019-03-10 NOTE — Telephone Encounter (Signed)
I believe this is [facility].  Routing to [place].

## 2019-03-11 NOTE — Telephone Encounter (Signed)
I have spoken with the Patient and she has her LCS CT schedule at [facility] on 03/24/2019 @ 10:00am. Patient is aware of the appt and location

## 2019-03-11 NOTE — Telephone Encounter (Signed)
New order placed.  Will forward back to [place] for scheduling.

## 2019-03-12 ENCOUNTER — Inpatient Hospital Stay: Admission: RE | Admit: 2019-03-12 | Payer: Medicare Other | Source: Ambulatory Visit

## 2019-03-24 ENCOUNTER — Ambulatory Visit
Admission: RE | Admit: 2019-03-24 | Discharge: 2019-03-24 | Disposition: A | Discharge status: Home / Self Care | Payer: Medicare Other | Source: Ambulatory Visit | Attending: Acute Care | Admitting: Acute Care

## 2019-03-24 DIAGNOSIS — Z122 Encounter for screening for malignant neoplasm of respiratory organs: Secondary | ICD-10-CM

## 2019-03-24 DIAGNOSIS — F17210 Nicotine dependence, cigarettes, uncomplicated: Secondary | ICD-10-CM

## 2019-03-24 DIAGNOSIS — Z87891 Personal history of nicotine dependence: Secondary | ICD-10-CM | POA: Diagnosis not present

## 2019-03-31 ENCOUNTER — Telehealth: Payer: Self-pay | Admitting: Acute Care

## 2019-03-31 DIAGNOSIS — Z87891 Personal history of nicotine dependence: Secondary | ICD-10-CM

## 2019-03-31 DIAGNOSIS — F17210 Nicotine dependence, cigarettes, uncomplicated: Secondary | ICD-10-CM

## 2019-03-31 DIAGNOSIS — Z122 Encounter for screening for malignant neoplasm of respiratory organs: Secondary | ICD-10-CM

## 2019-03-31 DIAGNOSIS — Z1231 Encounter for screening mammogram for malignant neoplasm of breast: Secondary | ICD-10-CM | POA: Diagnosis not present

## 2019-04-05 ENCOUNTER — Telehealth: Payer: Self-pay | Admitting: *Deleted

## 2019-04-05 NOTE — Telephone Encounter (Signed)
See telephone note 03/31/19 - Will close this message

## 2019-04-05 NOTE — Telephone Encounter (Signed)
LMTC x 1  

## 2019-04-05 NOTE — Telephone Encounter (Signed)
Pt informed of CT results per Sarah Groce, NP.  PT verbalized understanding.  Copy sent to PCP.  Order placed for 1 yr f/u CT.  

## 2019-04-07 DIAGNOSIS — H43813 Vitreous degeneration, bilateral: Secondary | ICD-10-CM | POA: Diagnosis not present

## 2019-04-07 DIAGNOSIS — H52203 Unspecified astigmatism, bilateral: Secondary | ICD-10-CM | POA: Diagnosis not present

## 2019-04-07 DIAGNOSIS — H2513 Age-related nuclear cataract, bilateral: Secondary | ICD-10-CM | POA: Diagnosis not present

## 2019-04-07 DIAGNOSIS — H11821 Conjunctivochalasis, right eye: Secondary | ICD-10-CM | POA: Diagnosis not present

## 2019-04-23 DIAGNOSIS — E785 Hyperlipidemia, unspecified: Secondary | ICD-10-CM | POA: Diagnosis not present

## 2019-04-23 DIAGNOSIS — M810 Age-related osteoporosis without current pathological fracture: Secondary | ICD-10-CM | POA: Diagnosis not present

## 2019-04-23 DIAGNOSIS — J449 Chronic obstructive pulmonary disease, unspecified: Secondary | ICD-10-CM | POA: Diagnosis not present

## 2019-04-23 DIAGNOSIS — J441 Chronic obstructive pulmonary disease with (acute) exacerbation: Secondary | ICD-10-CM | POA: Diagnosis not present

## 2019-04-23 DIAGNOSIS — E7800 Pure hypercholesterolemia, unspecified: Secondary | ICD-10-CM | POA: Diagnosis not present

## 2019-04-23 DIAGNOSIS — J40 Bronchitis, not specified as acute or chronic: Secondary | ICD-10-CM | POA: Diagnosis not present

## 2019-04-23 DIAGNOSIS — M19049 Primary osteoarthritis, unspecified hand: Secondary | ICD-10-CM | POA: Diagnosis not present

## 2019-05-28 DIAGNOSIS — M810 Age-related osteoporosis without current pathological fracture: Secondary | ICD-10-CM | POA: Diagnosis not present

## 2019-05-28 DIAGNOSIS — J40 Bronchitis, not specified as acute or chronic: Secondary | ICD-10-CM | POA: Diagnosis not present

## 2019-05-28 DIAGNOSIS — J449 Chronic obstructive pulmonary disease, unspecified: Secondary | ICD-10-CM | POA: Diagnosis not present

## 2019-05-28 DIAGNOSIS — M19049 Primary osteoarthritis, unspecified hand: Secondary | ICD-10-CM | POA: Diagnosis not present

## 2019-05-28 DIAGNOSIS — E7800 Pure hypercholesterolemia, unspecified: Secondary | ICD-10-CM | POA: Diagnosis not present

## 2019-05-28 DIAGNOSIS — E785 Hyperlipidemia, unspecified: Secondary | ICD-10-CM | POA: Diagnosis not present

## 2019-05-28 DIAGNOSIS — J441 Chronic obstructive pulmonary disease with (acute) exacerbation: Secondary | ICD-10-CM | POA: Diagnosis not present

## 2019-06-06 ENCOUNTER — Ambulatory Visit: Payer: Medicare Other

## 2019-06-11 ENCOUNTER — Ambulatory Visit: Payer: Medicare Other

## 2019-06-12 ENCOUNTER — Ambulatory Visit: Payer: Medicare Other

## 2019-07-01 DIAGNOSIS — E7800 Pure hypercholesterolemia, unspecified: Secondary | ICD-10-CM | POA: Diagnosis not present

## 2019-07-01 DIAGNOSIS — M19049 Primary osteoarthritis, unspecified hand: Secondary | ICD-10-CM | POA: Diagnosis not present

## 2019-07-01 DIAGNOSIS — J40 Bronchitis, not specified as acute or chronic: Secondary | ICD-10-CM | POA: Diagnosis not present

## 2019-07-01 DIAGNOSIS — J449 Chronic obstructive pulmonary disease, unspecified: Secondary | ICD-10-CM | POA: Diagnosis not present

## 2019-07-01 DIAGNOSIS — M810 Age-related osteoporosis without current pathological fracture: Secondary | ICD-10-CM | POA: Diagnosis not present

## 2019-07-01 DIAGNOSIS — E785 Hyperlipidemia, unspecified: Secondary | ICD-10-CM | POA: Diagnosis not present

## 2019-07-01 DIAGNOSIS — J441 Chronic obstructive pulmonary disease with (acute) exacerbation: Secondary | ICD-10-CM | POA: Diagnosis not present

## 2019-07-21 DIAGNOSIS — M19049 Primary osteoarthritis, unspecified hand: Secondary | ICD-10-CM | POA: Diagnosis not present

## 2019-07-21 DIAGNOSIS — J449 Chronic obstructive pulmonary disease, unspecified: Secondary | ICD-10-CM | POA: Diagnosis not present

## 2019-07-21 DIAGNOSIS — E7800 Pure hypercholesterolemia, unspecified: Secondary | ICD-10-CM | POA: Diagnosis not present

## 2019-07-21 DIAGNOSIS — E785 Hyperlipidemia, unspecified: Secondary | ICD-10-CM | POA: Diagnosis not present

## 2019-07-21 DIAGNOSIS — J441 Chronic obstructive pulmonary disease with (acute) exacerbation: Secondary | ICD-10-CM | POA: Diagnosis not present

## 2019-07-21 DIAGNOSIS — M810 Age-related osteoporosis without current pathological fracture: Secondary | ICD-10-CM | POA: Diagnosis not present

## 2019-07-21 DIAGNOSIS — J40 Bronchitis, not specified as acute or chronic: Secondary | ICD-10-CM | POA: Diagnosis not present

## 2019-08-25 ENCOUNTER — Ambulatory Visit: Payer: Medicare Other | Admitting: Orthopaedic Surgery

## 2019-08-27 DIAGNOSIS — M810 Age-related osteoporosis without current pathological fracture: Secondary | ICD-10-CM | POA: Diagnosis not present

## 2019-08-27 DIAGNOSIS — E785 Hyperlipidemia, unspecified: Secondary | ICD-10-CM | POA: Diagnosis not present

## 2019-08-27 DIAGNOSIS — M19049 Primary osteoarthritis, unspecified hand: Secondary | ICD-10-CM | POA: Diagnosis not present

## 2019-08-27 DIAGNOSIS — E7800 Pure hypercholesterolemia, unspecified: Secondary | ICD-10-CM | POA: Diagnosis not present

## 2019-08-27 DIAGNOSIS — J449 Chronic obstructive pulmonary disease, unspecified: Secondary | ICD-10-CM | POA: Diagnosis not present

## 2019-08-27 DIAGNOSIS — J441 Chronic obstructive pulmonary disease with (acute) exacerbation: Secondary | ICD-10-CM | POA: Diagnosis not present

## 2019-08-27 DIAGNOSIS — J40 Bronchitis, not specified as acute or chronic: Secondary | ICD-10-CM | POA: Diagnosis not present

## 2019-09-01 ENCOUNTER — Ambulatory Visit: Payer: Self-pay

## 2019-09-01 ENCOUNTER — Encounter: Payer: Self-pay | Admitting: Orthopaedic Surgery

## 2019-09-01 ENCOUNTER — Other Ambulatory Visit: Payer: Self-pay

## 2019-09-01 ENCOUNTER — Ambulatory Visit (INDEPENDENT_AMBULATORY_CARE_PROVIDER_SITE_OTHER): Payer: Medicare Other | Admitting: Physician Assistant

## 2019-09-01 ENCOUNTER — Ambulatory Visit (INDEPENDENT_AMBULATORY_CARE_PROVIDER_SITE_OTHER): Payer: Medicare Other

## 2019-09-01 DIAGNOSIS — M1712 Unilateral primary osteoarthritis, left knee: Secondary | ICD-10-CM

## 2019-09-01 DIAGNOSIS — M1711 Unilateral primary osteoarthritis, right knee: Secondary | ICD-10-CM | POA: Diagnosis not present

## 2019-09-01 MED ORDER — LIDOCAINE HCL 1 % IJ SOLN
0.5000 mL | INTRAMUSCULAR | Status: AC | PRN
  Administered 2019-09-01: .5 mL

## 2019-09-01 MED ORDER — LIDOCAINE HCL 1 % IJ SOLN
0.5000 mL | INTRAMUSCULAR | Status: AC | PRN
  Administered 2019-09-01: 12:00:00 .5 mL

## 2019-09-01 MED ORDER — METHYLPREDNISOLONE ACETATE 40 MG/ML IJ SUSP
40.0000 mg | INTRAMUSCULAR | Status: AC | PRN
  Administered 2019-09-01: 40 mg via INTRA_ARTICULAR

## 2019-09-01 NOTE — Progress Notes (Signed)
Office Visit Note   Patient: Chloe Green           Date of Birth: 04-Jun-1945           MRN: [id number] Visit Date: 09/01/2019              Requested by: Josetta Huddle, MD [address] [place],  [place] [address] PCP: Josetta Huddle, MD   Assessment & Plan: Visit Diagnoses:  1. Unilateral primary osteoarthritis, left knee   2. Unilateral primary osteoarthritis, right knee     Plan: Discussed with her quad strengthening exercises.  We will see how she does with the cortisone injections.  If these are short-lived she may benefit from supplemental injections and call our office so that we can order these.  She would like to have these done on separate days if she in fact ends up having supplemental injections.  Questions were encouraged and answered at length today.  Follow-Up Instructions: No follow-ups on file.   Orders:  Orders Placed This Encounter  Procedures  . Large Joint Inj  . XR Knee 1-2 Views Left  . XR Knee 1-2 Views Right   No orders of the defined types were placed in this encounter.     Procedures: Large Joint Inj: bilateral knee on 09/01/2019 11:42 AM Indications: pain Details: 22 G 1.5 in needle, anterolateral approach  Arthrogram: No  Medications (Right): 0.5 mL lidocaine 1 %; 40 mg methylPREDNISolone acetate 40 MG/ML Medications (Left): 0.5 mL lidocaine 1 %; 40 mg methylPREDNISolone acetate 40 MG/ML Outcome: tolerated well, no immediate complications Procedure, treatment alternatives, risks and benefits explained, specific risks discussed. Consent was given by the patient. Immediately prior to procedure a time out was called to verify the correct patient, procedure, equipment, support staff and site/side marked as required. Patient was prepped and draped in the usual sterile fashion.       Clinical Data: No additional findings.   Subjective: Chief Complaint  Patient presents with  . Left Knee - Pain  . Right Knee - Pain     HPI Patient comes in today with bilateral knee pain.  States knee is just achy a lot lately.  She had gel injections both knees 2 years ago.  She states she had severe pain could barely walk due to the pain in the right knee after the supplemental injection.  She is unsure how long her knees have been bothering her at this point.  She is having trouble now with going up and down stairs and feels as if her knees will give way on her.  No new injury.  Covid vaccine last 1 of 2 was given in February of this year. Review of Systems Negative for fevers chills shortness of breath chest pain.  Objective: Vital Signs: There were no vitals taken for this visit.  Physical Exam Constitutional:      Appearance: She is not ill-appearing or diaphoretic.  Pulmonary:     Effort: Pulmonary effort is normal.  Neurological:     Mental Status: She is alert and oriented to person, place, and time.  Psychiatric:        Mood and Affect: Mood normal.     Ortho Exam Bilateral knees good range of motion without significant pain.  Slight patellofemoral crepitus bilateral knees.  No instability valgus varus stressing.  No abnormal warmth erythema or effusion of either knee.  Negative McMurray's bilaterally. Specialty Comments:  No specialty comments available.  Imaging: XR Knee 1-2  Views Left  Result Date: 09/01/2019 Left knee 2 views: Medial lateral joint line well-maintained.  Patellofemoral changes mild.  The knee is well located.  No acute fractures no bony abnormalities otherwise.  XR Knee 1-2 Views Right  Result Date: 09/01/2019 Right knee 2 views: Medial lateral joint line well-maintained.  Patellofemoral changes mild to moderate.  No acute fractures no bony abnormalities.  Knee is well located.    PMFS History: Patient Active Problem List   Diagnosis Date Noted  . Chronic pain of both knees 08/21/2017  . Unilateral primary osteoarthritis, left knee 08/21/2017  . Unilateral primary  osteoarthritis, right knee 08/21/2017   Past Medical History:  Diagnosis Date  . COPD (chronic obstructive pulmonary disease) ([place])   . Degenerative disc disease, lumbar   . Diverticulosis   . Hypercholesteremia   . Mitral valve prolapse   . Panic attacks     History reviewed. No pertinent family history.  Past Surgical History:  Procedure Laterality Date  . ABDOMINAL HYSTERECTOMY     Social History   Occupational History  . Not on file  Tobacco Use  . Smoking status: Current Every Day Smoker    Packs/day: 1.00    Years: 45.00    Pack years: 45.00    Types: Cigarettes  Substance and Sexual Activity  . Alcohol use: No    Alcohol/week: 0.0 standard drinks  . Drug use: No  . Sexual activity: Not on file

## 2019-10-21 DIAGNOSIS — E7800 Pure hypercholesterolemia, unspecified: Secondary | ICD-10-CM | POA: Diagnosis not present

## 2019-10-21 DIAGNOSIS — M19049 Primary osteoarthritis, unspecified hand: Secondary | ICD-10-CM | POA: Diagnosis not present

## 2019-10-21 DIAGNOSIS — M810 Age-related osteoporosis without current pathological fracture: Secondary | ICD-10-CM | POA: Diagnosis not present

## 2019-10-21 DIAGNOSIS — J441 Chronic obstructive pulmonary disease with (acute) exacerbation: Secondary | ICD-10-CM | POA: Diagnosis not present

## 2019-10-21 DIAGNOSIS — J449 Chronic obstructive pulmonary disease, unspecified: Secondary | ICD-10-CM | POA: Diagnosis not present

## 2019-10-21 DIAGNOSIS — J40 Bronchitis, not specified as acute or chronic: Secondary | ICD-10-CM | POA: Diagnosis not present

## 2019-10-21 DIAGNOSIS — E785 Hyperlipidemia, unspecified: Secondary | ICD-10-CM | POA: Diagnosis not present

## 2019-11-03 ENCOUNTER — Telehealth: Payer: Self-pay | Admitting: Orthopaedic Surgery

## 2019-11-03 NOTE — Telephone Encounter (Signed)
Can ou advise please

## 2019-11-03 NOTE — Telephone Encounter (Signed)
Patient called requesting Dr. Ninfa Linden office to call insurance for approval of gel injection in both knees. Patient had made an appt 11/17/19 to receive gel injections w/ approval from insurance. Patient is asking for a call back once approval from insurance is sent. Patient phone number is [phone].

## 2019-11-04 NOTE — Telephone Encounter (Signed)
No order was placed in chart for this.  Last injection was 2019

## 2019-11-05 NOTE — Telephone Encounter (Signed)
Submitted for VOB for Monovisc-Bilateral knee  

## 2019-11-09 ENCOUNTER — Telehealth: Payer: Self-pay

## 2019-11-09 NOTE — Telephone Encounter (Signed)
Approved for Monovisc-Bilateral knee Buy and Bill Dr. Ninfa Linden No copay 2nd insurance to pick up cost after medicare No prior auth required

## 2019-11-09 NOTE — Telephone Encounter (Signed)
LVM informing her everything is ready for her appointment on the 14th

## 2019-11-17 ENCOUNTER — Encounter: Payer: Self-pay | Admitting: Orthopaedic Surgery

## 2019-11-17 ENCOUNTER — Ambulatory Visit (INDEPENDENT_AMBULATORY_CARE_PROVIDER_SITE_OTHER): Payer: Medicare Other | Admitting: Orthopaedic Surgery

## 2019-11-17 ENCOUNTER — Other Ambulatory Visit: Payer: Self-pay

## 2019-11-17 DIAGNOSIS — M1711 Unilateral primary osteoarthritis, right knee: Secondary | ICD-10-CM | POA: Diagnosis not present

## 2019-11-17 DIAGNOSIS — M1712 Unilateral primary osteoarthritis, left knee: Secondary | ICD-10-CM

## 2019-11-17 MED ORDER — HYALURONAN 88 MG/4ML IX SOSY
88.0000 mg | PREFILLED_SYRINGE | INTRA_ARTICULAR | Status: AC | PRN
  Administered 2019-11-17: 88 mg via INTRA_ARTICULAR

## 2019-11-17 NOTE — Progress Notes (Signed)
   Procedure Note  Patient: Chloe Green             Date of Birth: 02-Dec-1945           MRN: [id number]             Visit Date: 11/17/2019 Patient comes in today for Monovisc injections both knees.  States the cortisone injections actually made her knees worse.  She has had no new injury to either knee.  Physical exam: Bilateral knees no abnormal warmth erythema or effusion overall good range of motion of both knees.  Procedures: Visit Diagnoses:  1. Unilateral primary osteoarthritis, left knee   2. Unilateral primary osteoarthritis, right knee     Large Joint Inj: bilateral knee on 11/17/2019 11:10 AM Indications: pain Details: 22 G 1.5 in needle, anterolateral approach  Arthrogram: No  Medications (Right): 88 mg Hyaluronan 88 MG/4ML Medications (Left): 88 mg Hyaluronan 88 MG/4ML Outcome: tolerated well, no immediate complications Procedure, treatment alternatives, risks and benefits explained, specific risks discussed. Consent was given by the patient. Immediately prior to procedure a time out was called to verify the correct patient, procedure, equipment, support staff and site/side marked as required. Patient was prepped and draped in the usual sterile fashion.     Plan: Patient follow-up on as-needed basis.  She understands to wait least 6 months between supplemental injections.  Questions were encouraged and answered

## 2019-11-26 DIAGNOSIS — M545 Low back pain: Secondary | ICD-10-CM | POA: Diagnosis not present

## 2019-11-26 DIAGNOSIS — M542 Cervicalgia: Secondary | ICD-10-CM | POA: Diagnosis not present

## 2019-12-15 DIAGNOSIS — M8580 Other specified disorders of bone density and structure, unspecified site: Secondary | ICD-10-CM | POA: Diagnosis not present

## 2019-12-15 DIAGNOSIS — Z01419 Encounter for gynecological examination (general) (routine) without abnormal findings: Secondary | ICD-10-CM | POA: Diagnosis not present

## 2019-12-15 DIAGNOSIS — M859 Disorder of bone density and structure, unspecified: Secondary | ICD-10-CM | POA: Diagnosis not present

## 2019-12-15 DIAGNOSIS — Z6822 Body mass index (BMI) 22.0-22.9, adult: Secondary | ICD-10-CM | POA: Diagnosis not present

## 2020-01-21 DIAGNOSIS — Z23 Encounter for immunization: Secondary | ICD-10-CM | POA: Diagnosis not present

## 2020-02-02 IMAGING — CT CT CHEST LUNG CANCER SCREENING LOW DOSE W/O CM
2 of 5 series · 15 of 40 positions shown, 18 images · non-contrast
Comparison: 02/25/2018

CLINICAL DATA: 73-year-old female with 49 pack year history of
smoking. Lung cancer screening.

EXAM:
CT CHEST WITHOUT CONTRAST LOW-DOSE FOR LUNG CANCER SCREENING
TECHNIQUE: Multidetector CT imaging of the chest was performed following the
standard protocol without IV contrast.

[Series 4: lung 1.00 br44 cor · coronal · 0.58mm/px · 3 of 258 slices shown]
[im 52/258  lung]
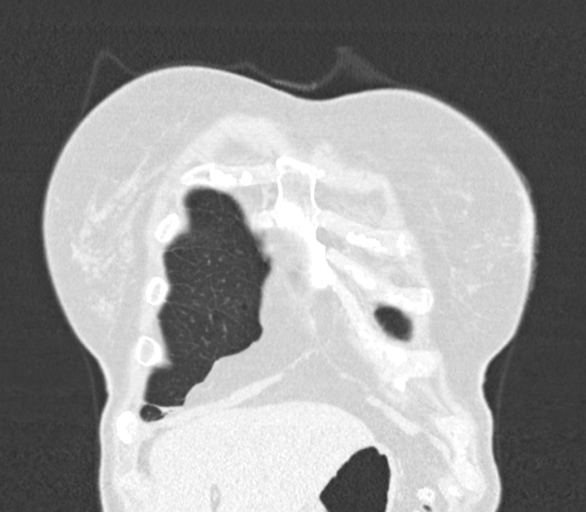
[im 103/258  lung]
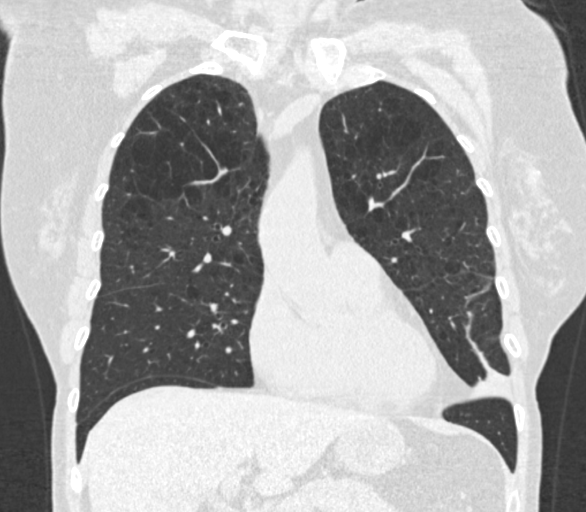
[im 155/258  lung]
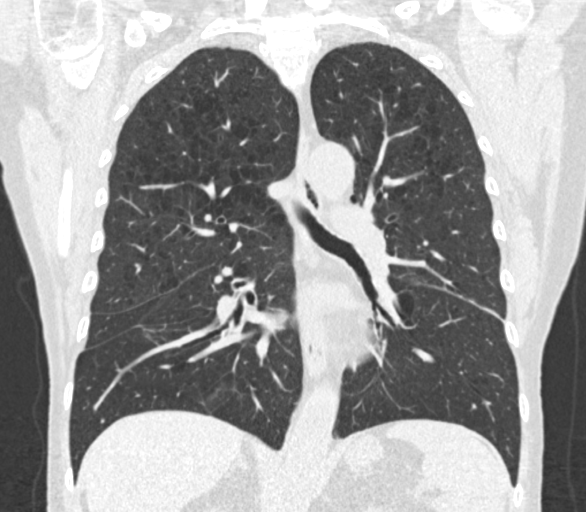

[Series 9: lung 1.00 br60 axial · axial · 0.66mm/px · z∈[-970,-700]mm · 12 of 302 slices shown, 15 images]
[im 16/302  mediastinal]
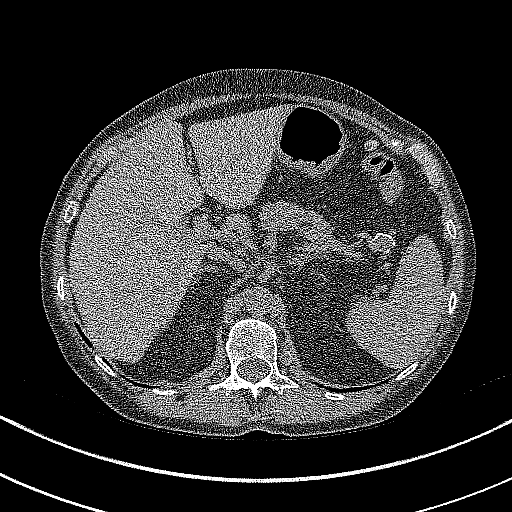
[im 16/302  lung]
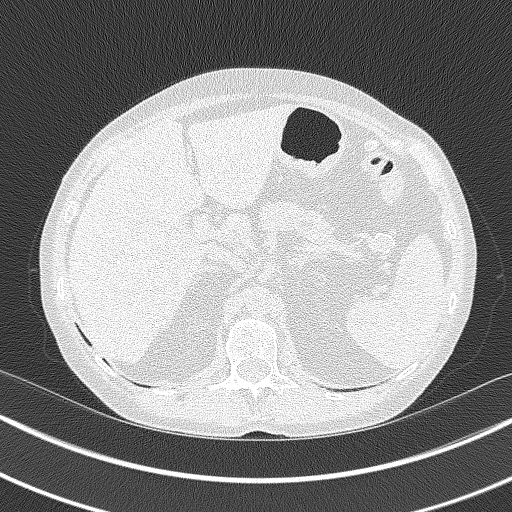
[im 46/302  lung]
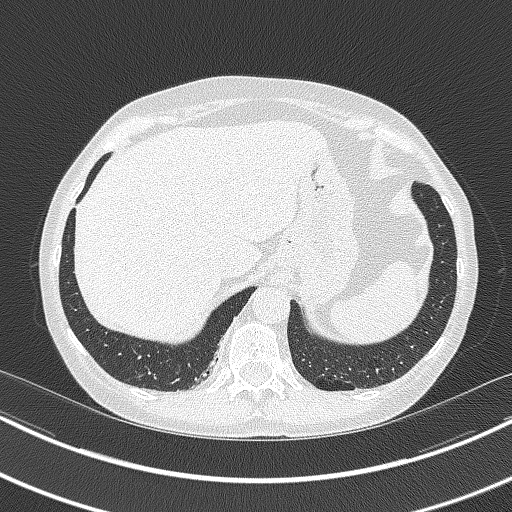
[im 61/302  lung]
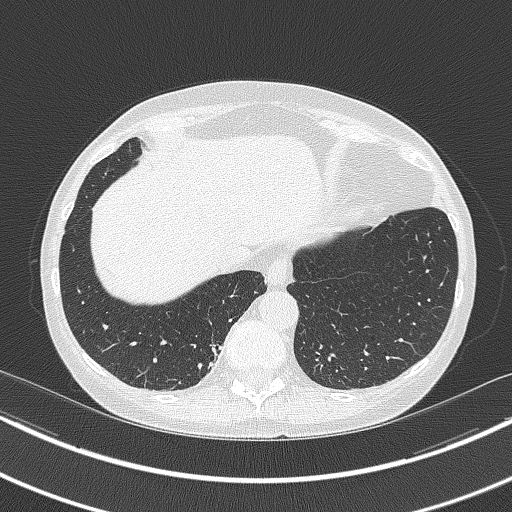
[im 91/302  lung]
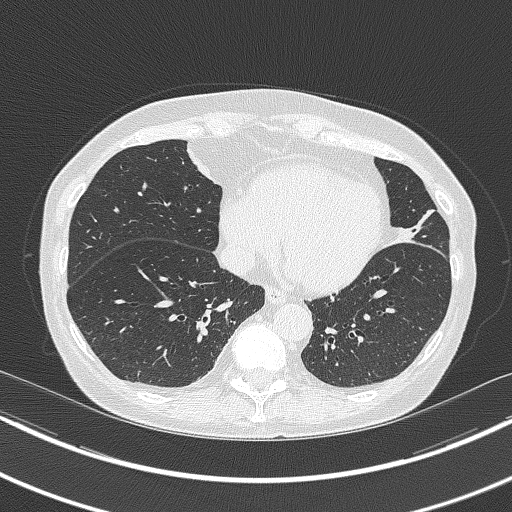
[im 121/302  mediastinal]
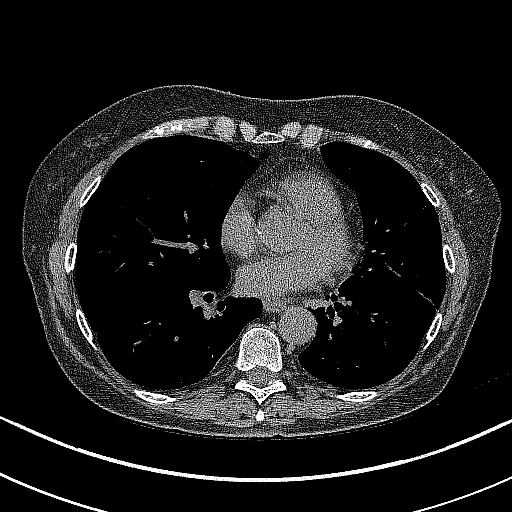
[im 121/302  lung]
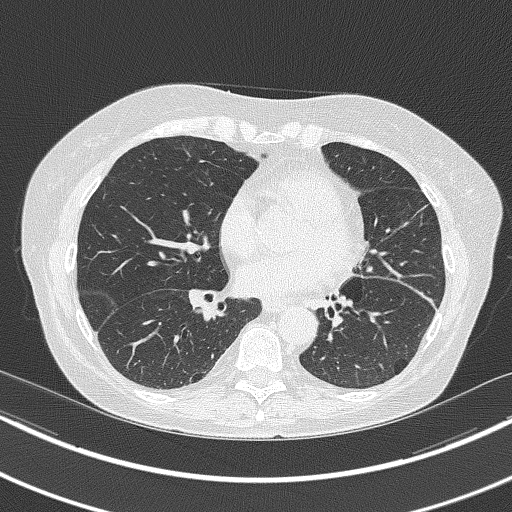
[im 136/302  lung]
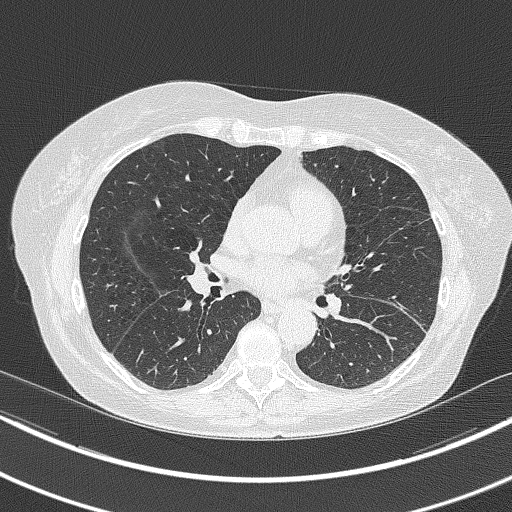
[im 166/302  lung]
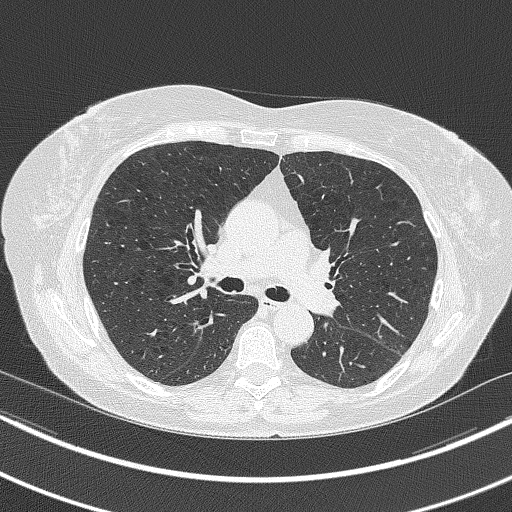
[im 181/302  lung]
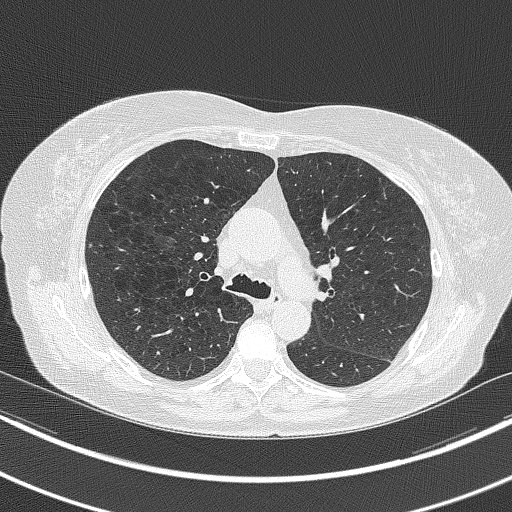
[im 211/302  mediastinal]
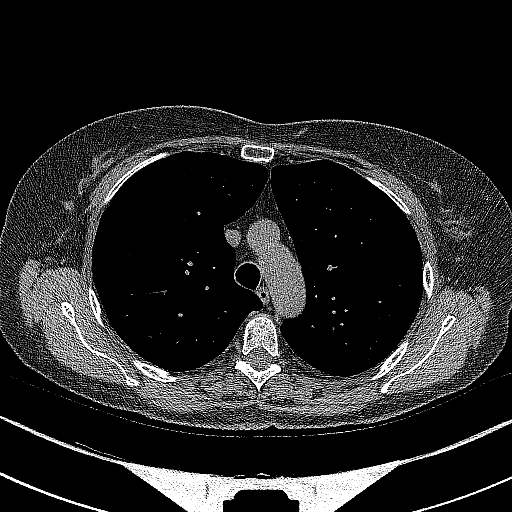
[im 211/302  lung]
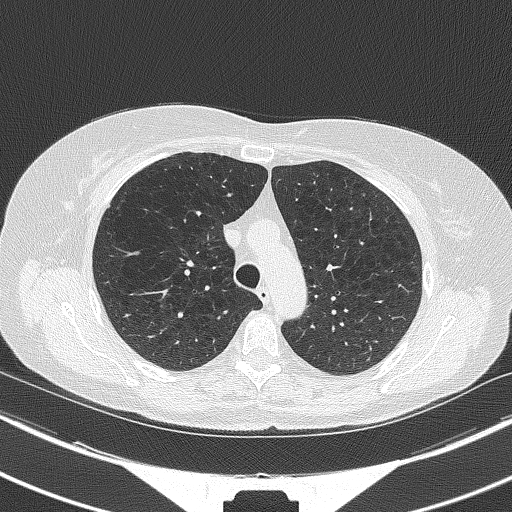
[im 241/302  lung]
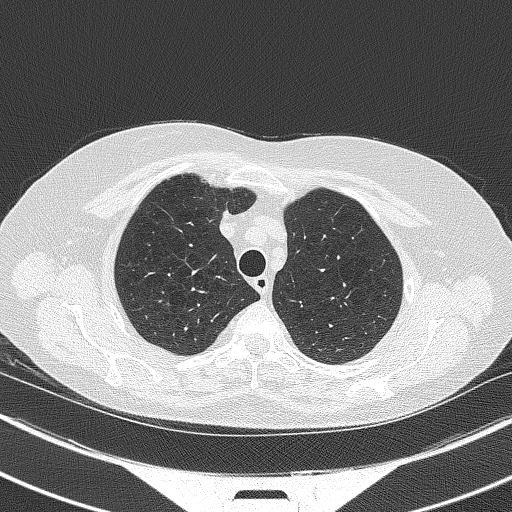
[im 256/302  lung]
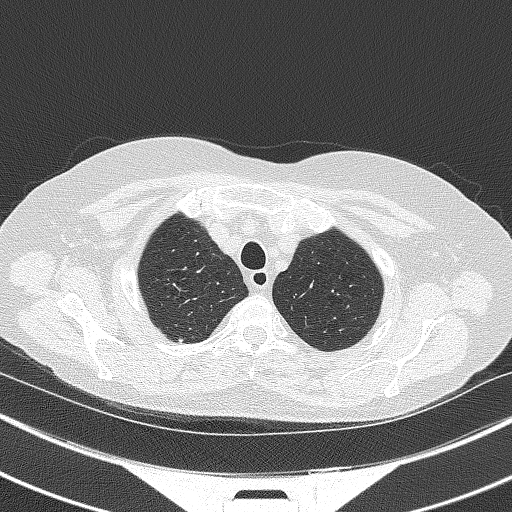
[im 286/302  lung]
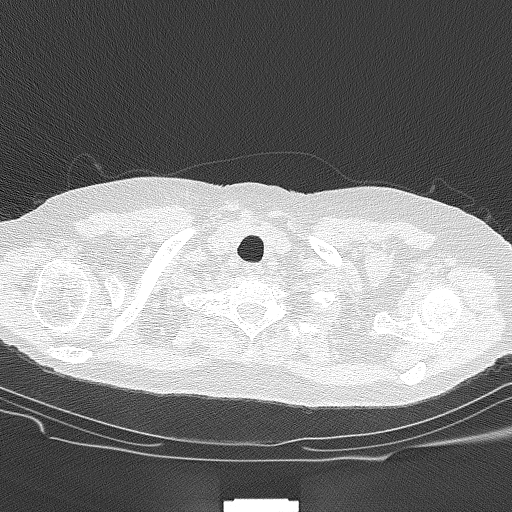

[15 of 40 positions shown; findings below may reference images not displayed]

FINDINGS: Cardiovascular: The heart size is normal. No substantial pericardial
effusion.

Mediastinum/Nodes: No mediastinal lymphadenopathy. Anterior
mediastinal nodule measured previously at 12 x 8 mm is stable at 12
x 8 mm today. No evidence for gross hilar lymphadenopathy although
assessment is limited by the lack of intravenous contrast on today's
study. The esophagus has normal imaging features. There is no
axillary lymphadenopathy.

Lungs/Pleura: Centrilobular and paraseptal emphysema evident.
Scattered tiny pulmonary nodules identified previously are stable in
the interval. Several additional tiny nodules are noted on today's
study without a new overtly suspicious nodule or mass.
Atelectasis/scarring in the right middle lobe, lingula, and right
lower lobe is similar to prior. No focal airspace consolidation. No
pulmonary edema or pleural effusion.

Upper Abdomen: 14 mm saccular aneurysm of the distal splenic artery
is stable in the interval. Upper abdomen otherwise unremarkable.

Musculoskeletal: No worrisome lytic or sclerotic osseous
abnormality.
IMPRESSION: 1. Lung-RADS 2, benign appearance or behavior. Continue annual
screening with low-dose chest CT without contrast in 12 months.
2. Stable saccular aneurysm distal splenic artery measuring 14 mm
today.
3. Emphysema ([id number]).

## 2020-02-10 DIAGNOSIS — Z23 Encounter for immunization: Secondary | ICD-10-CM | POA: Diagnosis not present

## 2020-02-11 DIAGNOSIS — E785 Hyperlipidemia, unspecified: Secondary | ICD-10-CM | POA: Diagnosis not present

## 2020-02-11 DIAGNOSIS — J449 Chronic obstructive pulmonary disease, unspecified: Secondary | ICD-10-CM | POA: Diagnosis not present

## 2020-02-11 DIAGNOSIS — M810 Age-related osteoporosis without current pathological fracture: Secondary | ICD-10-CM | POA: Diagnosis not present

## 2020-02-11 DIAGNOSIS — J441 Chronic obstructive pulmonary disease with (acute) exacerbation: Secondary | ICD-10-CM | POA: Diagnosis not present

## 2020-02-11 DIAGNOSIS — M19049 Primary osteoarthritis, unspecified hand: Secondary | ICD-10-CM | POA: Diagnosis not present

## 2020-02-11 DIAGNOSIS — J40 Bronchitis, not specified as acute or chronic: Secondary | ICD-10-CM | POA: Diagnosis not present

## 2020-02-11 DIAGNOSIS — E7800 Pure hypercholesterolemia, unspecified: Secondary | ICD-10-CM | POA: Diagnosis not present

## 2020-02-23 ENCOUNTER — Other Ambulatory Visit (HOSPITAL_COMMUNITY): Payer: Self-pay | Admitting: Internal Medicine

## 2020-02-23 ENCOUNTER — Ambulatory Visit: Payer: Medicare Other | Attending: Internal Medicine

## 2020-02-23 DIAGNOSIS — Z23 Encounter for immunization: Secondary | ICD-10-CM

## 2020-02-23 DIAGNOSIS — M25551 Pain in right hip: Secondary | ICD-10-CM | POA: Diagnosis not present

## 2020-02-23 DIAGNOSIS — M5441 Lumbago with sciatica, right side: Secondary | ICD-10-CM | POA: Diagnosis not present

## 2020-02-23 NOTE — Progress Notes (Signed)
   Covid-19 Vaccination Clinic  Name:  Chloe Green    MRN: [id number] DOB: 01-25-1946  02/23/2020  Chloe Green was observed post Covid-19 immunization for 15 minutes without incident. She was provided with Vaccine Information Sheet and instruction to access the V-Safe system.   Chloe Green was instructed to call 911 with any severe reactions post vaccine: Marland Kitchen Difficulty breathing  . Swelling of face and throat  . A fast heartbeat  . A bad rash all over body  . Dizziness and weakness

## 2020-03-01 DIAGNOSIS — M545 Low back pain, unspecified: Secondary | ICD-10-CM | POA: Diagnosis not present

## 2020-03-01 DIAGNOSIS — M5441 Lumbago with sciatica, right side: Secondary | ICD-10-CM | POA: Diagnosis not present

## 2020-03-08 DIAGNOSIS — E7800 Pure hypercholesterolemia, unspecified: Secondary | ICD-10-CM | POA: Diagnosis not present

## 2020-03-08 DIAGNOSIS — M19049 Primary osteoarthritis, unspecified hand: Secondary | ICD-10-CM | POA: Diagnosis not present

## 2020-03-08 DIAGNOSIS — F419 Anxiety disorder, unspecified: Secondary | ICD-10-CM | POA: Diagnosis not present

## 2020-03-08 DIAGNOSIS — M8580 Other specified disorders of bone density and structure, unspecified site: Secondary | ICD-10-CM | POA: Diagnosis not present

## 2020-03-08 DIAGNOSIS — J449 Chronic obstructive pulmonary disease, unspecified: Secondary | ICD-10-CM | POA: Diagnosis not present

## 2020-03-08 DIAGNOSIS — E559 Vitamin D deficiency, unspecified: Secondary | ICD-10-CM | POA: Diagnosis not present

## 2020-03-08 DIAGNOSIS — K5790 Diverticulosis of intestine, part unspecified, without perforation or abscess without bleeding: Secondary | ICD-10-CM | POA: Diagnosis not present

## 2020-03-08 DIAGNOSIS — J309 Allergic rhinitis, unspecified: Secondary | ICD-10-CM | POA: Diagnosis not present

## 2020-03-08 DIAGNOSIS — F17200 Nicotine dependence, unspecified, uncomplicated: Secondary | ICD-10-CM | POA: Diagnosis not present

## 2020-03-08 DIAGNOSIS — Z79899 Other long term (current) drug therapy: Secondary | ICD-10-CM | POA: Diagnosis not present

## 2020-03-08 DIAGNOSIS — Z0001 Encounter for general adult medical examination with abnormal findings: Secondary | ICD-10-CM | POA: Diagnosis not present

## 2020-03-08 DIAGNOSIS — M5431 Sciatica, right side: Secondary | ICD-10-CM | POA: Diagnosis not present

## 2020-03-09 DIAGNOSIS — M545 Low back pain, unspecified: Secondary | ICD-10-CM | POA: Diagnosis not present

## 2020-03-10 DIAGNOSIS — M4696 Unspecified inflammatory spondylopathy, lumbar region: Secondary | ICD-10-CM | POA: Diagnosis not present

## 2020-03-10 DIAGNOSIS — M5416 Radiculopathy, lumbar region: Secondary | ICD-10-CM | POA: Diagnosis not present

## 2020-03-24 ENCOUNTER — Ambulatory Visit
Admission: RE | Admit: 2020-03-24 | Discharge: 2020-03-24 | Disposition: A | Discharge status: Home / Self Care | Payer: Medicare Other | Source: Ambulatory Visit | Attending: Acute Care | Admitting: Acute Care

## 2020-03-24 DIAGNOSIS — F17210 Nicotine dependence, cigarettes, uncomplicated: Secondary | ICD-10-CM

## 2020-03-24 DIAGNOSIS — Z122 Encounter for screening for malignant neoplasm of respiratory organs: Secondary | ICD-10-CM

## 2020-03-24 DIAGNOSIS — J439 Emphysema, unspecified: Secondary | ICD-10-CM | POA: Diagnosis not present

## 2020-03-24 DIAGNOSIS — Z87891 Personal history of nicotine dependence: Secondary | ICD-10-CM | POA: Diagnosis not present

## 2020-03-24 DIAGNOSIS — I728 Aneurysm of other specified arteries: Secondary | ICD-10-CM | POA: Diagnosis not present

## 2020-03-24 DIAGNOSIS — R918 Other nonspecific abnormal finding of lung field: Secondary | ICD-10-CM | POA: Diagnosis not present

## 2020-03-29 NOTE — Progress Notes (Signed)

## 2020-03-31 DIAGNOSIS — J40 Bronchitis, not specified as acute or chronic: Secondary | ICD-10-CM | POA: Diagnosis not present

## 2020-03-31 DIAGNOSIS — E7800 Pure hypercholesterolemia, unspecified: Secondary | ICD-10-CM | POA: Diagnosis not present

## 2020-03-31 DIAGNOSIS — E785 Hyperlipidemia, unspecified: Secondary | ICD-10-CM | POA: Diagnosis not present

## 2020-03-31 DIAGNOSIS — J449 Chronic obstructive pulmonary disease, unspecified: Secondary | ICD-10-CM | POA: Diagnosis not present

## 2020-03-31 DIAGNOSIS — M19049 Primary osteoarthritis, unspecified hand: Secondary | ICD-10-CM | POA: Diagnosis not present

## 2020-03-31 DIAGNOSIS — K219 Gastro-esophageal reflux disease without esophagitis: Secondary | ICD-10-CM | POA: Diagnosis not present

## 2020-03-31 DIAGNOSIS — M810 Age-related osteoporosis without current pathological fracture: Secondary | ICD-10-CM | POA: Diagnosis not present

## 2020-03-31 DIAGNOSIS — J441 Chronic obstructive pulmonary disease with (acute) exacerbation: Secondary | ICD-10-CM | POA: Diagnosis not present

## 2020-04-03 ENCOUNTER — Other Ambulatory Visit: Payer: Self-pay | Admitting: *Deleted

## 2020-04-03 DIAGNOSIS — Z87891 Personal history of nicotine dependence: Secondary | ICD-10-CM

## 2020-04-03 DIAGNOSIS — F17210 Nicotine dependence, cigarettes, uncomplicated: Secondary | ICD-10-CM

## 2020-05-16 DIAGNOSIS — M810 Age-related osteoporosis without current pathological fracture: Secondary | ICD-10-CM | POA: Diagnosis not present

## 2020-05-16 DIAGNOSIS — E785 Hyperlipidemia, unspecified: Secondary | ICD-10-CM | POA: Diagnosis not present

## 2020-05-16 DIAGNOSIS — J449 Chronic obstructive pulmonary disease, unspecified: Secondary | ICD-10-CM | POA: Diagnosis not present

## 2020-05-16 DIAGNOSIS — E7800 Pure hypercholesterolemia, unspecified: Secondary | ICD-10-CM | POA: Diagnosis not present

## 2020-05-19 DIAGNOSIS — M545 Low back pain, unspecified: Secondary | ICD-10-CM | POA: Diagnosis not present

## 2020-05-19 DIAGNOSIS — M4696 Unspecified inflammatory spondylopathy, lumbar region: Secondary | ICD-10-CM | POA: Diagnosis not present

## 2020-06-16 DIAGNOSIS — M7022 Olecranon bursitis, left elbow: Secondary | ICD-10-CM | POA: Diagnosis not present

## 2020-06-28 ENCOUNTER — Telehealth: Payer: Self-pay

## 2020-06-28 DIAGNOSIS — E785 Hyperlipidemia, unspecified: Secondary | ICD-10-CM | POA: Diagnosis not present

## 2020-06-28 DIAGNOSIS — J40 Bronchitis, not specified as acute or chronic: Secondary | ICD-10-CM | POA: Diagnosis not present

## 2020-06-28 DIAGNOSIS — J441 Chronic obstructive pulmonary disease with (acute) exacerbation: Secondary | ICD-10-CM | POA: Diagnosis not present

## 2020-06-28 DIAGNOSIS — E7800 Pure hypercholesterolemia, unspecified: Secondary | ICD-10-CM | POA: Diagnosis not present

## 2020-06-28 NOTE — Telephone Encounter (Signed)
Patient called she would like to received another gel injection she would like to get pre approval process started call [phone]

## 2020-06-28 NOTE — Telephone Encounter (Signed)
Noted  

## 2020-07-05 ENCOUNTER — Telehealth: Payer: Self-pay | Admitting: Orthopaedic Surgery

## 2020-07-05 ENCOUNTER — Telehealth: Payer: Self-pay

## 2020-07-05 NOTE — Telephone Encounter (Signed)
Patient called requesting a call back as soon as possible about gel injections. Patient states she did change insurance company. Patient asked a couple weeks ago for pre approval for gel injections. Please call patient at [phone].

## 2020-07-05 NOTE — Telephone Encounter (Signed)
Submitted VOB for SynviscOne, bilateral knee.  Pending BV. 

## 2020-07-05 NOTE — Telephone Encounter (Signed)
Called and left a VM concerning gel injection.

## 2020-07-06 ENCOUNTER — Telehealth: Payer: Self-pay | Admitting: Orthopaedic Surgery

## 2020-07-06 ENCOUNTER — Telehealth: Payer: Self-pay

## 2020-07-06 NOTE — Telephone Encounter (Signed)
Pt would like you to call her! CB [phone]

## 2020-07-06 NOTE — Telephone Encounter (Signed)
Talked with patient concerning gel injection.  Advised patient that I will give her a call when she is approved.

## 2020-07-06 NOTE — Telephone Encounter (Signed)
See previous message in chart.  

## 2020-07-10 ENCOUNTER — Telehealth: Payer: Self-pay

## 2020-07-10 NOTE — Telephone Encounter (Signed)
Still awaiting benefits for SynviscOne, bilateral knee.

## 2020-07-11 ENCOUNTER — Telehealth: Payer: Self-pay

## 2020-07-11 NOTE — Telephone Encounter (Signed)
Called and left a VM for a case manager with [employer] concerning the status of benefits for SynviscOne, bilateral knee.

## 2020-07-13 ENCOUNTER — Telehealth: Payer: Self-pay

## 2020-07-13 NOTE — Telephone Encounter (Signed)
Noted.  See previous message in patient's chart.

## 2020-07-13 NOTE — Telephone Encounter (Signed)
Talked to [place] with [employer] concerning status of benefits for SynviscOne, bilateral knee.  Per Loma Sousa, benefits are still pending and they are having a delayed turn around time.  Advised me that she would send a message to have the case expedited and transferred the call to the case manager so I could leave a VM.

## 2020-07-13 NOTE — Telephone Encounter (Signed)
noted 

## 2020-07-13 NOTE — Telephone Encounter (Signed)
Patient called regarding status of injection I advised patient previous messages April left patient stated it has never taken her this long to hear back about the injection patient went on stating she hasn't heard anything back from April she stated she has been waiting for 2 weeks I advised patient the approval process doesn't take exactly 2 weeks  I advised patient its not [place] she is waiting on and when April receives anything from her insurance or from [employer]  she would be giving her a call patient started being rude saying "whatever" and how all of a sudden there is a problem with her insurance patient switched her insurance to [employer] which I explained why approval may be taking a while patient stated she will be calling back everyday until she hears anything call [phone]

## 2020-07-17 ENCOUNTER — Telehealth: Payer: Self-pay

## 2020-07-17 NOTE — Telephone Encounter (Signed)
Called and left a VM advising patient to CB to schedule an appointment with Dr. Ninfa Linden or Erskine Emery for gel injection.  Approved for SynviscOne, bilateral knee. [place] Patient will be responsible for 20% OOP. No Co-pay No PA required, per [employer]  Stated that no PA is required for J7325 and 20610. Reference# [email or web address].[id number]

## 2020-07-21 DIAGNOSIS — S51811A Laceration without foreign body of right forearm, initial encounter: Secondary | ICD-10-CM | POA: Diagnosis not present

## 2020-07-25 ENCOUNTER — Telehealth: Payer: Self-pay | Admitting: Orthopaedic Surgery

## 2020-07-25 NOTE — Telephone Encounter (Signed)
We should still see her for her knee.  We can always aspirate any fluid off of the knee before placing the injection in the knee tomorrow.

## 2020-07-25 NOTE — Telephone Encounter (Signed)
Yes she's fine we will still see her for that

## 2020-07-25 NOTE — Telephone Encounter (Signed)
Pt called and states she had a fall yesterday and her right knee is swollen. She is suppose to be getting an injection tomorrow and she is wondering if she can still get the injection with the knee swollen or not? She would like a call back [phone]

## 2020-07-26 ENCOUNTER — Encounter: Payer: Self-pay | Admitting: Physician Assistant

## 2020-07-26 ENCOUNTER — Ambulatory Visit (INDEPENDENT_AMBULATORY_CARE_PROVIDER_SITE_OTHER): Payer: Medicare Other | Admitting: Physician Assistant

## 2020-07-26 DIAGNOSIS — M1711 Unilateral primary osteoarthritis, right knee: Secondary | ICD-10-CM

## 2020-07-26 DIAGNOSIS — M1712 Unilateral primary osteoarthritis, left knee: Secondary | ICD-10-CM

## 2020-07-26 MED ORDER — HYLAN G-F 20 48 MG/6ML IX SOSY
48.0000 mg | PREFILLED_SYRINGE | INTRA_ARTICULAR | Status: AC | PRN
  Administered 2020-07-26: 48 mg via INTRA_ARTICULAR

## 2020-07-26 MED ORDER — LIDOCAINE HCL 1 % IJ SOLN
0.5000 mL | INTRAMUSCULAR | Status: AC | PRN
  Administered 2020-07-26: .5 mL

## 2020-07-26 NOTE — Progress Notes (Signed)
   Procedure Note  Patient: Chloe Green             Date of Birth: 07/05/1945           MRN: [id number]             Visit Date: 07/26/2020  Patient is diagnosed with osteoarthritis of the both knees.    Radiographs show evidence of joint space narrowing bilateral knees patellofemoral joint.  She has knee pain which interferes with functional and activities of daily living.    This patient has experienced inadequate response, adverse effects and/or intolerance with conservative treatments such as acetaminophen, NSAIDS, topical creams, physical therapy or regular exercise, knee bracing and/or weight loss.  She is not scheduled to have a total knee replacement within 6 months of starting treatment with viscosupplementation. States that she had a fall last Thursday on both knees.  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  Otherwise she states her knees started bothering her 2 months ago.  Last supplemental injections were given 11/17/19.   Procedures: Visit Diagnoses:  1. Unilateral primary osteoarthritis, left knee   2. Unilateral primary osteoarthritis, right knee     Large Joint Inj: bilateral knee on 07/26/2020 5:14 PM Indications: pain Details: 22 G 1.5 in needle, anterolateral approach  Arthrogram: No  Medications (Right): 0.5 mL lidocaine 1 %; 48 mg Hylan 48 MG/6ML Medications (Left): 0.5 mL lidocaine 1 %; 48 mg Hylan 48 MG/6ML Outcome: tolerated well, no immediate complications Procedure, treatment alternatives, risks and benefits explained, specific risks discussed. Consent was given by the patient. Immediately prior to procedure a time out was called to verify the correct patient, procedure, equipment, support staff and site/side marked as required. Patient was prepped and draped in the usual sterile fashion.     Plan: She understands to wait at least  6 months between injections.  Both knees were wrapped with Ace bandages ice was applied.  Questions  encouraged and answered at length.

## 2020-08-02 ENCOUNTER — Encounter: Payer: Self-pay | Admitting: Dermatology

## 2020-08-02 ENCOUNTER — Other Ambulatory Visit: Payer: Self-pay

## 2020-08-02 ENCOUNTER — Ambulatory Visit: Payer: Medicare Other | Admitting: Dermatology

## 2020-08-02 DIAGNOSIS — T148XXA Other injury of unspecified body region, initial encounter: Secondary | ICD-10-CM

## 2020-08-02 DIAGNOSIS — Z1283 Encounter for screening for malignant neoplasm of skin: Secondary | ICD-10-CM | POA: Diagnosis not present

## 2020-08-02 DIAGNOSIS — B351 Tinea unguium: Secondary | ICD-10-CM

## 2020-08-02 DIAGNOSIS — D235 Other benign neoplasm of skin of trunk: Secondary | ICD-10-CM

## 2020-08-02 DIAGNOSIS — L821 Other seborrheic keratosis: Secondary | ICD-10-CM | POA: Diagnosis not present

## 2020-08-02 DIAGNOSIS — D369 Benign neoplasm, unspecified site: Secondary | ICD-10-CM

## 2020-08-02 DIAGNOSIS — L918 Other hypertrophic disorders of the skin: Secondary | ICD-10-CM | POA: Diagnosis not present

## 2020-08-02 DIAGNOSIS — D1801 Hemangioma of skin and subcutaneous tissue: Secondary | ICD-10-CM | POA: Diagnosis not present

## 2020-08-02 DIAGNOSIS — D692 Other nonthrombocytopenic purpura: Secondary | ICD-10-CM

## 2020-08-02 NOTE — Patient Instructions (Addendum)
Once bruise has healed use Over the Counter DerMend Moisturizing Bruise Formula Cream.

## 2020-08-17 ENCOUNTER — Encounter: Payer: Self-pay | Admitting: Dermatology

## 2020-08-17 NOTE — Progress Notes (Signed)
   Follow-Up Visit   Subjective  Chloe Green is a 75 y.o. female who presents for the following: Annual Exam (Right post neck raised to touch not painful no real concerns).  General skin examination Location:  Duration:  Quality:  Associated Signs/Symptoms: Modifying Factors:  Severity:  Timing: Context:   Objective  Well appearing patient in no apparent distress; mood and affect are within normal limits. Objective  Left Inguinal Area: Full body skin check.   Mole upper inner right breast normal dermoscopy  Objective  Left Upper Back: Pedunculated 3 mm flesh-colored papule  Objective  Left Lower Back: Multiple chest, back 1 to 2 mm smooth red papules  Objective  Left Lower Eyelid, Left Upper Arm - Posterior, Right Buccal Cheek, Right Upper Back: Multiple, all over 2 to 75mm brown textured flattopped papules  Objective  Right Lower Back: Verrucous 39mm tan papule  Objective  Left Forearm - Posterior: Scattered 1 cm ecchymoses on forearms, no history of abnormal bleeding.    A full examination was performed including scalp, head, eyes, ears, nose, lips, neck, chest, axillae, abdomen, back, buttocks, bilateral upper extremities, bilateral lower extremities, hands, feet, fingers, toes, fingernails, and toenails. All findings within normal limits unless otherwise noted below.  Areas beneath undergarments not fully examined.   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      I, Lavonna Monarch, MD, have reviewed all documentation for this visit.  The documentation on 08/17/20 for the exam, diagnosis, procedures, and orders are all accurate and complete.

## 2020-09-13 DIAGNOSIS — Z0100 Encounter for examination of eyes and vision without abnormal findings: Secondary | ICD-10-CM | POA: Diagnosis not present

## 2020-09-13 DIAGNOSIS — H524 Presbyopia: Secondary | ICD-10-CM | POA: Diagnosis not present

## 2020-10-03 DIAGNOSIS — E785 Hyperlipidemia, unspecified: Secondary | ICD-10-CM | POA: Diagnosis not present

## 2020-10-03 DIAGNOSIS — J449 Chronic obstructive pulmonary disease, unspecified: Secondary | ICD-10-CM | POA: Diagnosis not present

## 2020-10-03 DIAGNOSIS — J441 Chronic obstructive pulmonary disease with (acute) exacerbation: Secondary | ICD-10-CM | POA: Diagnosis not present

## 2020-10-03 DIAGNOSIS — J40 Bronchitis, not specified as acute or chronic: Secondary | ICD-10-CM | POA: Diagnosis not present

## 2020-10-26 DIAGNOSIS — J40 Bronchitis, not specified as acute or chronic: Secondary | ICD-10-CM | POA: Diagnosis not present

## 2020-10-26 DIAGNOSIS — J441 Chronic obstructive pulmonary disease with (acute) exacerbation: Secondary | ICD-10-CM | POA: Diagnosis not present

## 2020-10-26 DIAGNOSIS — E785 Hyperlipidemia, unspecified: Secondary | ICD-10-CM | POA: Diagnosis not present

## 2020-10-26 DIAGNOSIS — E7800 Pure hypercholesterolemia, unspecified: Secondary | ICD-10-CM | POA: Diagnosis not present

## 2020-12-20 ENCOUNTER — Other Ambulatory Visit: Payer: Self-pay

## 2020-12-20 ENCOUNTER — Ambulatory Visit: Payer: Medicare Other | Admitting: Orthopaedic Surgery

## 2020-12-20 ENCOUNTER — Encounter: Payer: Self-pay | Admitting: Orthopaedic Surgery

## 2020-12-20 DIAGNOSIS — M1712 Unilateral primary osteoarthritis, left knee: Secondary | ICD-10-CM | POA: Diagnosis not present

## 2020-12-20 DIAGNOSIS — G8929 Other chronic pain: Secondary | ICD-10-CM

## 2020-12-20 DIAGNOSIS — M1711 Unilateral primary osteoarthritis, right knee: Secondary | ICD-10-CM | POA: Diagnosis not present

## 2020-12-20 DIAGNOSIS — M25562 Pain in left knee: Secondary | ICD-10-CM

## 2020-12-20 DIAGNOSIS — M25561 Pain in right knee: Secondary | ICD-10-CM | POA: Diagnosis not present

## 2020-12-20 NOTE — Progress Notes (Signed)
The patient comes in today frustrated as to the pain she is experiencing in both her knees.  She has known well-documented osteoarthritis that is moderate in both knees.  It has not been bone-on-bone wear but it is getting slowly worse with time.  She has had hyaluronic acid injections since 2016 on a regular basis in her knees and they have lasted a long period time each time she has the injections.  He was always with Monovisc and shows had a good response with Monovisc.  However in March of this year she had Synvisc 1 in both her knees after changing her insurance company and that is what her insurance company covers.  She has not had the same response and she continues to have knee pain in both knees.  She is hoping to have Monovisc again at the 29-monthstandpoint both knees.  She understands we will have to petition her insurance company to see if we can get them to cover this Monovisc instead of hyaluronic acid with Synvisc given that this was helped her for so many years.  Both knees are painful throughout the arc of motion.  Neither knee has an effusion but has global tenderness consistent with her known osteoarthritis.  This point I would like to obtain a MRI of the right knee to get a better assessment of the cartilage and see if things are worsening for her since she had no response with the Synvisc 1 injection.  I will also at least dictate an appeal letter for insurance company to see if they will cover Monovisc as opposed to Synvisc 1 since that is helped her so much in the past.  Her only other option is considering knee replacement surgery which she still hopes to stay conservative and avoid.  I will see her back in 2 weeks hopefully go over the MRI of her right knee.

## 2020-12-21 ENCOUNTER — Other Ambulatory Visit: Payer: Self-pay

## 2020-12-21 DIAGNOSIS — M25561 Pain in right knee: Secondary | ICD-10-CM

## 2020-12-21 DIAGNOSIS — M1711 Unilateral primary osteoarthritis, right knee: Secondary | ICD-10-CM

## 2020-12-21 DIAGNOSIS — G8929 Other chronic pain: Secondary | ICD-10-CM

## 2020-12-22 ENCOUNTER — Telehealth: Payer: Self-pay

## 2020-12-22 NOTE — Telephone Encounter (Signed)
Faxed Appeals letter to [facility] at [phone] for gel injection.

## 2020-12-25 ENCOUNTER — Telehealth: Payer: Self-pay | Admitting: Orthopaedic Surgery

## 2020-12-25 DIAGNOSIS — E785 Hyperlipidemia, unspecified: Secondary | ICD-10-CM | POA: Diagnosis not present

## 2020-12-25 DIAGNOSIS — J441 Chronic obstructive pulmonary disease with (acute) exacerbation: Secondary | ICD-10-CM | POA: Diagnosis not present

## 2020-12-25 DIAGNOSIS — M810 Age-related osteoporosis without current pathological fracture: Secondary | ICD-10-CM | POA: Diagnosis not present

## 2020-12-25 DIAGNOSIS — K219 Gastro-esophageal reflux disease without esophagitis: Secondary | ICD-10-CM | POA: Diagnosis not present

## 2020-12-25 NOTE — Telephone Encounter (Signed)
Talked with Chloe Green with [facility] and was advised that letter and office note needed to be faxed to [employer] at [phone] concerning gel injection.  Faxed letter and office note to [phone], attn: [facility] [employer]

## 2020-12-25 NOTE — Telephone Encounter (Signed)
[employer] [place] Mariann Laster) calling to speak with April about a fax that they received concerning this pt. The best call back number is [phone].

## 2020-12-28 ENCOUNTER — Telehealth: Payer: Self-pay | Admitting: Orthopaedic Surgery

## 2020-12-28 ENCOUNTER — Telehealth: Payer: Self-pay

## 2020-12-28 NOTE — Telephone Encounter (Signed)
Pt calling to see if she can get the ins code for her knee MRI to see if she can afford the MRI and know what she has to pay on her end. Pt unsure if she wants to keep her appt for the MRI if her copay for it would be to expensive. The best call back number for the pt is [phone].

## 2020-12-28 NOTE — Telephone Encounter (Signed)
Received a call from representative with [facility] Medicare concerning appeal for gel injection.  Rep. Stated that additional information was needed.  Provided rep.with the information.   Appeal still Pending.

## 2020-12-29 ENCOUNTER — Telehealth: Payer: Self-pay

## 2020-12-29 NOTE — Telephone Encounter (Signed)
Pt called this mroning and stated she does not want to do the MRI since she got approved for the Monovisc. Mri referral has been closed

## 2020-12-29 NOTE — Telephone Encounter (Signed)
Submitted for Monovisc, bilateral knee. Pending BV.

## 2020-12-29 NOTE — Telephone Encounter (Signed)
Talked with Hassan Rowan at [facility] and was advised that patient is approved for Monovisc, bilateral knee.  Cb# for Hassan Rowan is [phone], opt.5.  Approval# [id number] Valid 12/25/2020- 06/27/2021  Appt. 01/31/2021 with Erskine Emery

## 2021-01-01 ENCOUNTER — Telehealth: Payer: Self-pay

## 2021-01-01 NOTE — Telephone Encounter (Signed)
Approved for Monovisc, bilateral knee. [place] Patient will be responsible for 20% OOP. Co-pay of $25.00 PA Approval# [id number] Valid 12/25/2020- 06/27/2021  Appt.01/31/2021 with Erskine Emery

## 2021-01-02 ENCOUNTER — Telehealth: Payer: Self-pay | Admitting: Orthopaedic Surgery

## 2021-01-02 NOTE — Telephone Encounter (Signed)
Patient called. She would like to know if she still needs a MRI? Her call back number is [phone]

## 2021-01-02 NOTE — Telephone Encounter (Signed)
Lvm for pt to cb to inform 

## 2021-01-02 NOTE — Telephone Encounter (Signed)
Pt was approved for the gel injection. Do you still want the MRI?

## 2021-01-03 ENCOUNTER — Ambulatory Visit: Payer: Medicare Other | Admitting: Orthopaedic Surgery

## 2021-01-03 NOTE — Telephone Encounter (Signed)
Called and advised. Pt stated understanding  °

## 2021-01-22 DIAGNOSIS — M545 Low back pain, unspecified: Secondary | ICD-10-CM | POA: Diagnosis not present

## 2021-01-30 ENCOUNTER — Telehealth: Payer: Self-pay

## 2021-01-30 NOTE — Telephone Encounter (Signed)
Talked with patient concerning letter that she received from [facility].  Patient will bring letter when she comes to her office visit on Wednesday, 01/31/2021.

## 2021-01-31 ENCOUNTER — Encounter: Payer: Self-pay | Admitting: Radiology

## 2021-01-31 ENCOUNTER — Telehealth: Payer: Self-pay | Admitting: Acute Care

## 2021-01-31 ENCOUNTER — Encounter: Payer: Self-pay | Admitting: Physician Assistant

## 2021-01-31 ENCOUNTER — Ambulatory Visit: Payer: Medicare Other | Admitting: Physician Assistant

## 2021-01-31 ENCOUNTER — Other Ambulatory Visit: Payer: Self-pay

## 2021-01-31 DIAGNOSIS — M1711 Unilateral primary osteoarthritis, right knee: Secondary | ICD-10-CM

## 2021-01-31 DIAGNOSIS — M1712 Unilateral primary osteoarthritis, left knee: Secondary | ICD-10-CM

## 2021-01-31 MED ORDER — HYALURONAN 88 MG/4ML IX SOSY
88.0000 mg | PREFILLED_SYRINGE | INTRA_ARTICULAR | Status: AC | PRN
  Administered 2021-01-31: 88 mg via INTRA_ARTICULAR

## 2021-01-31 NOTE — Progress Notes (Signed)
Per letter received from patient asking for additional information on services received at [place] called [employer] and s/w Esther B.  This letter is in follow up to the authorization of the [place], which I verified is current and valid.  [employer] wants that specific info in reference to the Monovisc injections to be administered today.  I have advised Autumn H to tell patient she will need to send in the paid in full receipt, etc per letter's request, and I will help gather the other info to send in.  Sherlynn Stalls B also suggests sending in clinical notes with the letter that Dr Ninfa Linden submitted prior to receiving PA.  I will follow up on this, and will then have the letter in reference scanned to chart for future reference.

## 2021-01-31 NOTE — Telephone Encounter (Signed)
Mrs. 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  She went back review each CT report and noticed that the aneurysm was getting bigger. Her PCP has not mentioned the aneurysm and is concerned that is has gotten bigger and she knows from her research that the aneurysm can be a problem. Would you please call her to put her mind at ease

## 2021-01-31 NOTE — Telephone Encounter (Signed)
I have put patient on schedule with sarah at 9am tomorrow for phone visit. Called and informed patient as well. Patient verbalized understanding, nothing further needed.

## 2021-01-31 NOTE — Progress Notes (Signed)
   Procedure Note  Patient: Chloe Green             Date of Birth: 1946-02-14           MRN: [id number]             Visit Date: 01/31/2021 HPI patient returns today for Monovisc injection of both knees.  She has known osteoarthritis both knees.  Radiographs show evidence of joint space narrowing bilateral knees and patellofemoral joint arthritic changes.  She has pain that interferes with her daily life.  She has had poor response to other conservative treatments.  She is not scheduled for knee surgery within the next 6 months.  No new injury.  Physical exam: Bilateral knees good range of motion both knees.  No abnormal warmth erythema or effusion Procedures: Visit Diagnoses:  1. Unilateral primary osteoarthritis, right knee   2. Unilateral primary osteoarthritis, left knee     Large Joint Inj: bilateral knee on 01/31/2021 2:18 PM Indications: pain Details: 22 G 1.5 in needle, anterolateral approach  Arthrogram: No  Medications (Right): 88 mg Hyaluronan 88 MG/4ML Medications (Left): 88 mg Hyaluronan 88 MG/4ML Outcome: tolerated well, no immediate complications Procedure, treatment alternatives, risks and benefits explained, specific risks discussed. Consent was given by the patient. Immediately prior to procedure a time out was called to verify the correct patient, procedure, equipment, support staff and site/side marked as required. Patient was prepped and draped in the usual sterile fashion.    Plan: We will see her back in 8 weeks to see what type of response she had to the injections.  Recommend that she take her Celebrex this evening.  Ice packs were applied to both knees.  Questions encouraged and answered.

## 2021-02-01 ENCOUNTER — Ambulatory Visit (INDEPENDENT_AMBULATORY_CARE_PROVIDER_SITE_OTHER): Payer: Medicare Other | Admitting: Acute Care

## 2021-02-01 ENCOUNTER — Encounter: Payer: Self-pay | Admitting: Acute Care

## 2021-02-01 DIAGNOSIS — Z87891 Personal history of nicotine dependence: Secondary | ICD-10-CM

## 2021-02-01 NOTE — Patient Instructions (Signed)
I am enclosing a  print out of 03/2020 scan ( Last scan performed) to review with Dr. Inda Merlin at your 03/2021  annual physical Please  self advocate for referral to surgery for continued monitoring of aneurysm .  You will need this for annual monitoring of this area after your last low dose CT scan after age 75 Follow up annual low dose CT Chest as scheduled for 03/28/2021. We will pay close attention to this when we review the scan , and we will refer to surgery if there has been any change in its size. Please work on quitting smoking

## 2021-02-01 NOTE — Progress Notes (Signed)
Virtual Visit via Telephone Note  I connected with Chloe Green on 02/01/21 at  9:00 AM EDT by telephone and verified that I am speaking with the correct person using two identifiers.  Location: Patient: At home Provider: [place], [place], [place], [address]    I discussed the limitations, risks, security and privacy concerns of performing an evaluation and management service by telephone and the availability of in person appointments. I also discussed with the patient that there may be a patient responsible charge related to this service. The patient expressed understanding and agreed to proceed.   History of Present Illness: Pt. 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 She said she had not really paid attention to this until recently and was concerned. .This has been a consistent  finding over the last 5 years and it is documented as a stable aneurysm in each scan. .I have reviewed all scans from 2016- her last which was done in 03/2020 with the patient. We discussed that this aneurysm has been between 14 mm and 15 mm with each read. She does not have hypertension. We discussed that she will age out of the lung cancer screening program after her age 61 scan and that she should continue to get CT scans to monitor this annually through her PCP from age 20 on. I have encouraged her to talk with Dr. Inda Merlin about this at her upcoming physical in November. I have encouraged her to self advocate for a referral to surgeon for monitoring of this as she is worried about it. We did discuss the importance of BP control.I will send her a copy of her 2021 scan to review with him at her upcoming appointment.  She has her annual screening scan scheduled for 03/28/2021, where we will pay attention to this detail. She verbalized understanding of this and had no further questions at  completion of the call.     Observations/Objective: 03/24/2020 LDCT Lung-RADS 2, benign appearance or behavior. Continue annual screening with low-dose chest CT without contrast in 12 months. 2. 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 3. Emphysema (ICD10-J43.9).  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 Inda Merlin at her 03/2021 physical I have encouraged her to self advocate for referral to surgery for continued monitoring of aneurysm. Follow up annual low dose CT Chest scheduled for 03/28/2021. We will pay close attention to this when we review the scan , and we will refer to surgery if there has been any change in its size. Encouraged patient to work on quitting smoking.  Follow Up Instructions: Follow up annual low dose CT Chest scheduled for 03/28/2021.    I discussed the assessment and treatment plan with the patient. The patient was provided an opportunity to ask questions and all were answered. The patient agreed with the plan and demonstrated an understanding of the instructions.   The patient was advised to call back or seek an in-person evaluation if the symptoms worsen or if the condition fails to improve as anticipated.  I spent 50 minutes dedicated to the care of this patient on the date of this encounter to include pre-visit review of records, non-face-to-face  time with the patient discussing conditions above, post visit ordering of testing, clinical documentation with the electronic health record, making appropriate referrals as documented, and communicating necessary information to the patient's healthcare team.    Magdalen Spatz, NP  02/01/2021

## 2021-02-11 DIAGNOSIS — S20212A Contusion of left front wall of thorax, initial encounter: Secondary | ICD-10-CM | POA: Diagnosis not present

## 2021-03-15 DIAGNOSIS — K219 Gastro-esophageal reflux disease without esophagitis: Secondary | ICD-10-CM | POA: Diagnosis not present

## 2021-03-15 DIAGNOSIS — E042 Nontoxic multinodular goiter: Secondary | ICD-10-CM | POA: Diagnosis not present

## 2021-03-15 DIAGNOSIS — J449 Chronic obstructive pulmonary disease, unspecified: Secondary | ICD-10-CM | POA: Diagnosis not present

## 2021-03-15 DIAGNOSIS — E559 Vitamin D deficiency, unspecified: Secondary | ICD-10-CM | POA: Diagnosis not present

## 2021-03-15 DIAGNOSIS — Z79890 Hormone replacement therapy (postmenopausal): Secondary | ICD-10-CM | POA: Diagnosis not present

## 2021-03-15 DIAGNOSIS — Z0001 Encounter for general adult medical examination with abnormal findings: Secondary | ICD-10-CM | POA: Diagnosis not present

## 2021-03-15 DIAGNOSIS — E7800 Pure hypercholesterolemia, unspecified: Secondary | ICD-10-CM | POA: Diagnosis not present

## 2021-03-28 ENCOUNTER — Encounter: Payer: Self-pay | Admitting: Physician Assistant

## 2021-03-28 ENCOUNTER — Ambulatory Visit (INDEPENDENT_AMBULATORY_CARE_PROVIDER_SITE_OTHER)
Admission: RE | Admit: 2021-03-28 | Discharge: 2021-03-28 | Disposition: A | Discharge status: Home / Self Care | Payer: Medicare Other | Source: Ambulatory Visit

## 2021-03-28 ENCOUNTER — Ambulatory Visit: Payer: Medicare Other | Admitting: Physician Assistant

## 2021-03-28 ENCOUNTER — Other Ambulatory Visit: Payer: Self-pay

## 2021-03-28 ENCOUNTER — Ambulatory Visit: Payer: Medicare Other

## 2021-03-28 DIAGNOSIS — Z87891 Personal history of nicotine dependence: Secondary | ICD-10-CM

## 2021-03-28 DIAGNOSIS — M1712 Unilateral primary osteoarthritis, left knee: Secondary | ICD-10-CM

## 2021-03-28 DIAGNOSIS — I728 Aneurysm of other specified arteries: Secondary | ICD-10-CM

## 2021-03-28 DIAGNOSIS — M1711 Unilateral primary osteoarthritis, right knee: Secondary | ICD-10-CM

## 2021-03-28 DIAGNOSIS — F17210 Nicotine dependence, cigarettes, uncomplicated: Secondary | ICD-10-CM

## 2021-03-28 DIAGNOSIS — J439 Emphysema, unspecified: Secondary | ICD-10-CM | POA: Diagnosis not present

## 2021-03-28 NOTE — Progress Notes (Signed)
HPI: Chloe Green returns today status post bilateral knee supplemental injections.  She states that the injections did help she is no longer having daily pain in the knees.  Only complaint is her right knee bothers her sometimes at night and awakens her.  She states that she gets up when the knee is bothering her and moves around and her pain goes away.  She has had no new injury to either knee.  She continues to take her Celebrex.  Review of systems see HPI otherwise negative  Physical exam: Bilateral knees: Good range of motion of both knees.  Left knee with patellofemoral crepitus with passive range of motion.  Plan: She will work on quad strengthening exercises as shown today.  She understands to wait at least 6 months between injections.  She has had difficulty getting Monovisc in the past and needs to relate to the office staff whenever she calls to schedule the supplemental injections that she wants Monovisc.  Questions were encouraged and answered at length.  Follow-up as needed.

## 2021-03-31 DIAGNOSIS — K219 Gastro-esophageal reflux disease without esophagitis: Secondary | ICD-10-CM | POA: Diagnosis not present

## 2021-03-31 DIAGNOSIS — M810 Age-related osteoporosis without current pathological fracture: Secondary | ICD-10-CM | POA: Diagnosis not present

## 2021-03-31 DIAGNOSIS — E7800 Pure hypercholesterolemia, unspecified: Secondary | ICD-10-CM | POA: Diagnosis not present

## 2021-03-31 DIAGNOSIS — E785 Hyperlipidemia, unspecified: Secondary | ICD-10-CM | POA: Diagnosis not present

## 2021-04-02 ENCOUNTER — Telehealth: Payer: Self-pay | Admitting: Acute Care

## 2021-04-02 NOTE — Telephone Encounter (Signed)
Left message for pt to call back to discuss lung screening CT results.  

## 2021-04-12 ENCOUNTER — Other Ambulatory Visit: Payer: Self-pay | Admitting: Acute Care

## 2021-04-12 DIAGNOSIS — F17210 Nicotine dependence, cigarettes, uncomplicated: Secondary | ICD-10-CM

## 2021-04-12 DIAGNOSIS — Z87891 Personal history of nicotine dependence: Secondary | ICD-10-CM

## 2021-04-12 DIAGNOSIS — Z1231 Encounter for screening mammogram for malignant neoplasm of breast: Secondary | ICD-10-CM | POA: Diagnosis not present

## 2021-05-09 ENCOUNTER — Telehealth: Payer: Self-pay | Admitting: Physician Assistant

## 2021-05-09 NOTE — Telephone Encounter (Signed)
Pt called requesting a call back from Chloe J. Pt states she has questions about her approval for gel injections. Please call pt at [phone].

## 2021-05-09 NOTE — Telephone Encounter (Signed)
Talked with patient concerning letter that she received from her insurance.  Advised patient that she needed to contact her insurance to discuss more about the letter.  Patient voiced that she understands.

## 2021-06-11 DIAGNOSIS — Z79890 Hormone replacement therapy (postmenopausal): Secondary | ICD-10-CM | POA: Diagnosis not present

## 2021-06-11 DIAGNOSIS — F17200 Nicotine dependence, unspecified, uncomplicated: Secondary | ICD-10-CM | POA: Diagnosis not present

## 2021-07-12 ENCOUNTER — Telehealth: Payer: Self-pay

## 2021-07-12 NOTE — Telephone Encounter (Signed)
VOB submitted for Monovisc, bilateral knee. ?BV pending. ?Appointment needs to be after 07/31/2021 ? ?

## 2021-07-25 ENCOUNTER — Telehealth: Payer: Self-pay

## 2021-07-25 NOTE — Telephone Encounter (Signed)
Called and left a Vm for patient to return my call concerning appointment for gel injection. ?

## 2021-08-08 ENCOUNTER — Ambulatory Visit: Payer: Medicare Other | Admitting: Dermatology

## 2021-08-15 ENCOUNTER — Ambulatory Visit: Payer: Medicare Other | Admitting: Dermatology

## 2021-08-15 ENCOUNTER — Encounter: Payer: Self-pay | Admitting: Dermatology

## 2021-08-15 DIAGNOSIS — B351 Tinea unguium: Secondary | ICD-10-CM | POA: Diagnosis not present

## 2021-08-15 DIAGNOSIS — D239 Other benign neoplasm of skin, unspecified: Secondary | ICD-10-CM

## 2021-08-15 DIAGNOSIS — L821 Other seborrheic keratosis: Secondary | ICD-10-CM

## 2021-08-15 DIAGNOSIS — L308 Other specified dermatitis: Secondary | ICD-10-CM

## 2021-08-15 DIAGNOSIS — D2371 Other benign neoplasm of skin of right lower limb, including hip: Secondary | ICD-10-CM

## 2021-08-15 DIAGNOSIS — Z1283 Encounter for screening for malignant neoplasm of skin: Secondary | ICD-10-CM

## 2021-08-15 MED ORDER — CLOBETASOL PROPIONATE 0.05 % EX CREA: 1.0000 "application " | TOPICAL_CREAM | Freq: Two times a day (BID) | CUTANEOUS | 2 refills | Status: AC

## 2021-08-15 MED ORDER — CLOBETASOL PROPIONATE 0.05 % EX CREA: 1.0000 "application " | TOPICAL_CREAM | Freq: Two times a day (BID) | CUTANEOUS | 0 refills | Status: DC

## 2021-09-02 ENCOUNTER — Encounter: Payer: Self-pay | Admitting: Dermatology

## 2021-09-02 NOTE — Progress Notes (Signed)
? ?  Follow-Up Visit ?  ?Subjective  ?Chloe Green is a 76 y.o. female who presents for the following: Annual Exam (Yearly skin check questions for Dr Denna Haggard patient wouldn't be specific. Personal history severe left mid back 2016/). ? ?Annual exam, recheck toenails (did see Dr. Kayleen Memos) ?Location:  ?Duration:  ?Quality:  ?Associated Signs/Symptoms: ?Modifying Factors:  ?Severity:  ?Timing: ?Context:  ? ?Objective  ?Well appearing patient in no apparent distress; mood and affect are within normal limits. ?No atypical nevi or signs of NMSC noted at the time of the visit. Angiomas on the torso and Seb K's that are all stable  ? ?Mid Back ?Under the bra area that itches and on the buttock and left cheek facial. Patient  stated she didn't want a scar; defer biopsy ? ?Right Foot - Anterior ?Both feet and patient already seen Dr.Christy Adigun (states no treatment was recommended). ? ?Left Thigh - Anterior ?Eczema like lesions that come and go that she scratches  ? ? ? ?A full examination was performed including scalp, head, eyes, ears, nose, lips, neck, chest, axillae, abdomen, back, buttocks, bilateral upper extremities, bilateral lower extremities, hands, feet, fingers, toes, fingernails, and toenails. All findings within normal limits unless otherwise noted below.  Areas beneath undergarments not fully examined ? ? ?Assessment & Plan  ? ? ?Screening exam for skin cancer ? ?Yearly skin checks. ? ?Seborrheic keratosis ?Mid Back ? ?Toenail fungus ?Right Foot - Anterior ? ?No intervention initiated. ? ?Other eczema ?Left Thigh - Anterior ? ?Topical treatment then possible next step biopsy.  Apply clobetasol as soon as new lesion appears daily after bathing for up to 4 weeks.  Avoid use on face and body folds. ? ?Related Medications ?clobetasol cream (TEMOVATE) 0.05 % ?Apply 1 application. topically 2 (two) times daily. ? ?Dermatofibroma ?Right Lower Leg - Anterior ? ? ? ? ? ?I, Lavonna Monarch, MD, have reviewed all  documentation for this visit.  The documentation on 09/02/21 for the exam, diagnosis, procedures, and orders are all accurate and complete. ?

## 2021-09-13 DIAGNOSIS — J449 Chronic obstructive pulmonary disease, unspecified: Secondary | ICD-10-CM | POA: Diagnosis not present

## 2021-09-13 DIAGNOSIS — M810 Age-related osteoporosis without current pathological fracture: Secondary | ICD-10-CM | POA: Diagnosis not present

## 2021-09-13 DIAGNOSIS — K219 Gastro-esophageal reflux disease without esophagitis: Secondary | ICD-10-CM | POA: Diagnosis not present

## 2021-09-13 DIAGNOSIS — E7800 Pure hypercholesterolemia, unspecified: Secondary | ICD-10-CM | POA: Diagnosis not present

## 2021-10-03 DIAGNOSIS — M810 Age-related osteoporosis without current pathological fracture: Secondary | ICD-10-CM | POA: Diagnosis not present

## 2021-10-03 DIAGNOSIS — J343 Hypertrophy of nasal turbinates: Secondary | ICD-10-CM | POA: Diagnosis not present

## 2021-10-03 DIAGNOSIS — J449 Chronic obstructive pulmonary disease, unspecified: Secondary | ICD-10-CM | POA: Diagnosis not present

## 2021-10-03 DIAGNOSIS — K219 Gastro-esophageal reflux disease without esophagitis: Secondary | ICD-10-CM | POA: Diagnosis not present

## 2021-10-03 DIAGNOSIS — E785 Hyperlipidemia, unspecified: Secondary | ICD-10-CM | POA: Diagnosis not present

## 2021-10-03 DIAGNOSIS — M26623 Arthralgia of bilateral temporomandibular joint: Secondary | ICD-10-CM | POA: Diagnosis not present

## 2021-10-04 DIAGNOSIS — H04123 Dry eye syndrome of bilateral lacrimal glands: Secondary | ICD-10-CM | POA: Diagnosis not present

## 2021-10-25 DIAGNOSIS — M810 Age-related osteoporosis without current pathological fracture: Secondary | ICD-10-CM | POA: Diagnosis not present

## 2021-10-25 DIAGNOSIS — E785 Hyperlipidemia, unspecified: Secondary | ICD-10-CM | POA: Diagnosis not present

## 2021-10-25 DIAGNOSIS — J449 Chronic obstructive pulmonary disease, unspecified: Secondary | ICD-10-CM | POA: Diagnosis not present

## 2021-10-25 DIAGNOSIS — K219 Gastro-esophageal reflux disease without esophagitis: Secondary | ICD-10-CM | POA: Diagnosis not present

## 2021-11-02 DIAGNOSIS — H2513 Age-related nuclear cataract, bilateral: Secondary | ICD-10-CM | POA: Diagnosis not present

## 2021-11-02 DIAGNOSIS — H04123 Dry eye syndrome of bilateral lacrimal glands: Secondary | ICD-10-CM | POA: Diagnosis not present

## 2021-11-23 ENCOUNTER — Other Ambulatory Visit: Payer: Self-pay

## 2021-11-23 DIAGNOSIS — M1711 Unilateral primary osteoarthritis, right knee: Secondary | ICD-10-CM

## 2021-11-23 DIAGNOSIS — M1712 Unilateral primary osteoarthritis, left knee: Secondary | ICD-10-CM

## 2021-11-29 DIAGNOSIS — H04123 Dry eye syndrome of bilateral lacrimal glands: Secondary | ICD-10-CM | POA: Diagnosis not present

## 2021-12-12 ENCOUNTER — Other Ambulatory Visit: Payer: Self-pay | Admitting: Obstetrics and Gynecology

## 2021-12-12 DIAGNOSIS — M8580 Other specified disorders of bone density and structure, unspecified site: Secondary | ICD-10-CM | POA: Diagnosis not present

## 2021-12-12 DIAGNOSIS — M810 Age-related osteoporosis without current pathological fracture: Secondary | ICD-10-CM

## 2021-12-12 DIAGNOSIS — Z79890 Hormone replacement therapy (postmenopausal): Secondary | ICD-10-CM | POA: Diagnosis not present

## 2021-12-12 DIAGNOSIS — Z124 Encounter for screening for malignant neoplasm of cervix: Secondary | ICD-10-CM | POA: Diagnosis not present

## 2021-12-12 DIAGNOSIS — Z1239 Encounter for other screening for malignant neoplasm of breast: Secondary | ICD-10-CM | POA: Diagnosis not present

## 2021-12-21 DIAGNOSIS — M8589 Other specified disorders of bone density and structure, multiple sites: Secondary | ICD-10-CM | POA: Diagnosis not present

## 2021-12-21 DIAGNOSIS — Z780 Asymptomatic menopausal state: Secondary | ICD-10-CM | POA: Diagnosis not present

## 2022-01-03 DIAGNOSIS — M810 Age-related osteoporosis without current pathological fracture: Secondary | ICD-10-CM | POA: Diagnosis not present

## 2022-01-03 DIAGNOSIS — J449 Chronic obstructive pulmonary disease, unspecified: Secondary | ICD-10-CM | POA: Diagnosis not present

## 2022-01-03 DIAGNOSIS — K219 Gastro-esophageal reflux disease without esophagitis: Secondary | ICD-10-CM | POA: Diagnosis not present

## 2022-01-03 DIAGNOSIS — E785 Hyperlipidemia, unspecified: Secondary | ICD-10-CM | POA: Diagnosis not present

## 2022-01-10 DIAGNOSIS — H04123 Dry eye syndrome of bilateral lacrimal glands: Secondary | ICD-10-CM | POA: Diagnosis not present

## 2022-01-10 DIAGNOSIS — H2513 Age-related nuclear cataract, bilateral: Secondary | ICD-10-CM | POA: Diagnosis not present

## 2022-01-18 DIAGNOSIS — E785 Hyperlipidemia, unspecified: Secondary | ICD-10-CM | POA: Diagnosis not present

## 2022-01-18 DIAGNOSIS — K219 Gastro-esophageal reflux disease without esophagitis: Secondary | ICD-10-CM | POA: Diagnosis not present

## 2022-01-18 DIAGNOSIS — M810 Age-related osteoporosis without current pathological fracture: Secondary | ICD-10-CM | POA: Diagnosis not present

## 2022-01-18 DIAGNOSIS — J449 Chronic obstructive pulmonary disease, unspecified: Secondary | ICD-10-CM | POA: Diagnosis not present

## 2022-01-22 DIAGNOSIS — U071 COVID-19: Secondary | ICD-10-CM | POA: Diagnosis not present

## 2022-02-15 DIAGNOSIS — J310 Chronic rhinitis: Secondary | ICD-10-CM | POA: Diagnosis not present

## 2022-02-15 DIAGNOSIS — R0982 Postnasal drip: Secondary | ICD-10-CM | POA: Diagnosis not present

## 2022-02-15 DIAGNOSIS — J343 Hypertrophy of nasal turbinates: Secondary | ICD-10-CM | POA: Diagnosis not present

## 2022-02-15 DIAGNOSIS — J342 Deviated nasal septum: Secondary | ICD-10-CM | POA: Diagnosis not present

## 2022-02-27 DIAGNOSIS — M810 Age-related osteoporosis without current pathological fracture: Secondary | ICD-10-CM | POA: Diagnosis not present

## 2022-02-27 DIAGNOSIS — J449 Chronic obstructive pulmonary disease, unspecified: Secondary | ICD-10-CM | POA: Diagnosis not present

## 2022-02-27 DIAGNOSIS — E7800 Pure hypercholesterolemia, unspecified: Secondary | ICD-10-CM | POA: Diagnosis not present

## 2022-02-27 DIAGNOSIS — K219 Gastro-esophageal reflux disease without esophagitis: Secondary | ICD-10-CM | POA: Diagnosis not present

## 2022-03-05 DIAGNOSIS — J449 Chronic obstructive pulmonary disease, unspecified: Secondary | ICD-10-CM | POA: Diagnosis not present

## 2022-03-05 DIAGNOSIS — K219 Gastro-esophageal reflux disease without esophagitis: Secondary | ICD-10-CM | POA: Diagnosis not present

## 2022-03-05 DIAGNOSIS — J309 Allergic rhinitis, unspecified: Secondary | ICD-10-CM | POA: Diagnosis not present

## 2022-03-05 DIAGNOSIS — F17200 Nicotine dependence, unspecified, uncomplicated: Secondary | ICD-10-CM | POA: Diagnosis not present

## 2022-04-01 ENCOUNTER — Ambulatory Visit
Admission: RE | Admit: 2022-04-01 | Discharge: 2022-04-01 | Disposition: A | Discharge status: Home / Self Care | Payer: Medicare Other | Source: Ambulatory Visit | Attending: Internal Medicine | Admitting: Internal Medicine

## 2022-04-01 DIAGNOSIS — F17210 Nicotine dependence, cigarettes, uncomplicated: Secondary | ICD-10-CM

## 2022-04-01 DIAGNOSIS — Z87891 Personal history of nicotine dependence: Secondary | ICD-10-CM

## 2022-04-03 ENCOUNTER — Other Ambulatory Visit: Payer: Self-pay | Admitting: Acute Care

## 2022-04-03 DIAGNOSIS — Z87891 Personal history of nicotine dependence: Secondary | ICD-10-CM

## 2022-04-03 DIAGNOSIS — Z122 Encounter for screening for malignant neoplasm of respiratory organs: Secondary | ICD-10-CM

## 2022-04-03 DIAGNOSIS — F17210 Nicotine dependence, cigarettes, uncomplicated: Secondary | ICD-10-CM

## 2022-04-05 DIAGNOSIS — J310 Chronic rhinitis: Secondary | ICD-10-CM | POA: Diagnosis not present

## 2022-04-05 DIAGNOSIS — J343 Hypertrophy of nasal turbinates: Secondary | ICD-10-CM | POA: Diagnosis not present

## 2022-04-05 DIAGNOSIS — J342 Deviated nasal septum: Secondary | ICD-10-CM | POA: Diagnosis not present

## 2022-04-05 DIAGNOSIS — R0982 Postnasal drip: Secondary | ICD-10-CM | POA: Diagnosis not present

## 2022-04-12 DIAGNOSIS — Z0000 Encounter for general adult medical examination without abnormal findings: Secondary | ICD-10-CM | POA: Diagnosis not present

## 2022-04-17 DIAGNOSIS — Z79890 Hormone replacement therapy (postmenopausal): Secondary | ICD-10-CM | POA: Diagnosis not present

## 2022-04-18 DIAGNOSIS — Z1231 Encounter for screening mammogram for malignant neoplasm of breast: Secondary | ICD-10-CM | POA: Diagnosis not present

## 2022-05-01 ENCOUNTER — Telehealth: Payer: Self-pay | Admitting: Orthopedic Surgery

## 2022-05-01 NOTE — Telephone Encounter (Signed)
Chloe Green called in because she states that it is time for her to receive another round of Monovisc, bilateral knees.  She states the last injections lasted her over a year.  She is aware that she needs special authorization from her insurance company and she would like to go ahead and get this started.  If she needs an appointment Wed, Thurs, and Fridays are best for her.  She can be reached at [phone].

## 2022-05-22 ENCOUNTER — Encounter: Payer: Self-pay | Admitting: Orthopaedic Surgery

## 2022-05-22 ENCOUNTER — Ambulatory Visit (INDEPENDENT_AMBULATORY_CARE_PROVIDER_SITE_OTHER): Payer: Medicare Other

## 2022-05-22 ENCOUNTER — Telehealth: Payer: Self-pay

## 2022-05-22 ENCOUNTER — Ambulatory Visit: Payer: Self-pay

## 2022-05-22 ENCOUNTER — Ambulatory Visit: Payer: Medicare Other | Admitting: Orthopaedic Surgery

## 2022-05-22 DIAGNOSIS — M1712 Unilateral primary osteoarthritis, left knee: Secondary | ICD-10-CM

## 2022-05-22 DIAGNOSIS — M1711 Unilateral primary osteoarthritis, right knee: Secondary | ICD-10-CM

## 2022-05-22 NOTE — Telephone Encounter (Signed)
Bilateral Monovisc

## 2022-05-22 NOTE — Telephone Encounter (Signed)
VOB submitted for Monovisc, bilateral knee.  

## 2022-05-22 NOTE — Progress Notes (Signed)
The patient is well-known to me.  She is 77 years old and has been dealing with moderate arthritis of both her knees for many years now.  In September 2022 we did place Monovisc in both knees which she did excellent from.  It has been a year and a half and she would like to have those injections again in her knees.  She has had no acute change in her medical status.  She is very active at 59.  She does not have to walk with an assistive device.  She is much younger appearing than her stated age.  Both knees were examined and have some global tenderness but no effusion.  Both knees have excellent range of motion and some patellofemoral crepitation.  Both knees on clinical exam and x-rays have mild to moderate osteoarthritis.  X-rays of both knees still show well-maintained medial and lateral compartments which is slight narrowing.  There is more narrowing of the patellofemoral joint bilaterally.  Given the fact that Monovisc is helped her for a year and a half, it is prudent to order hyaluronic acid with Monovisc again for both knees.  This will be for treating the pain of her mild to moderate osteoarthritis.  She has failed conservative treatment the past including steroid injections.  She will continue to work on activity modification and quad strengthening exercises in the interim.

## 2022-05-29 DIAGNOSIS — R300 Dysuria: Secondary | ICD-10-CM | POA: Diagnosis not present

## 2022-05-29 DIAGNOSIS — N952 Postmenopausal atrophic vaginitis: Secondary | ICD-10-CM | POA: Diagnosis not present

## 2022-05-29 DIAGNOSIS — N898 Other specified noninflammatory disorders of vagina: Secondary | ICD-10-CM | POA: Diagnosis not present

## 2022-06-07 ENCOUNTER — Telehealth: Payer: Self-pay

## 2022-06-07 NOTE — Telephone Encounter (Signed)
Submitted PA online through [facility] for Monovisc, bilateral.   PA pending# [id number]

## 2022-06-11 ENCOUNTER — Other Ambulatory Visit: Payer: Self-pay

## 2022-06-11 ENCOUNTER — Telehealth: Payer: Self-pay

## 2022-06-11 DIAGNOSIS — M1711 Unilateral primary osteoarthritis, right knee: Secondary | ICD-10-CM

## 2022-06-11 DIAGNOSIS — M1712 Unilateral primary osteoarthritis, left knee: Secondary | ICD-10-CM

## 2022-06-11 NOTE — Telephone Encounter (Signed)
Received a fax from [facility] stating that No PA is required due to patient having a grandfathered authorization on file Auth.# [id number] Effective 05/06/2022- [employer]

## 2022-06-11 NOTE — Telephone Encounter (Signed)
Called and left a VM for patient to CB to schedule for gel injection with Dr. Blackman or Gil.  Check referral tab  

## 2022-06-27 ENCOUNTER — Ambulatory Visit: Payer: Medicare Other | Admitting: Physician Assistant

## 2022-06-27 ENCOUNTER — Encounter: Payer: Self-pay | Admitting: Physician Assistant

## 2022-06-27 DIAGNOSIS — M170 Bilateral primary osteoarthritis of knee: Secondary | ICD-10-CM | POA: Diagnosis not present

## 2022-06-27 DIAGNOSIS — M1711 Unilateral primary osteoarthritis, right knee: Secondary | ICD-10-CM | POA: Diagnosis not present

## 2022-06-27 DIAGNOSIS — M1712 Unilateral primary osteoarthritis, left knee: Secondary | ICD-10-CM

## 2022-06-27 MED ORDER — HYALURONAN 88 MG/4ML IX SOSY
88.0000 mg | PREFILLED_SYRINGE | INTRA_ARTICULAR | Status: AC | PRN
  Administered 2022-06-27: 88 mg via INTRA_ARTICULAR

## 2022-06-27 NOTE — Progress Notes (Signed)
   Procedure Note  Patient: Chloe Green             Date of Birth: 1946/01/16           MRN: [id number]             Visit Date: 06/27/2022  HPI: Fraser Din returns today for scheduled Monovisc injections both knees.  She has known osteoarthritis bilateral knees.  Radiographs show narrowing of bilateral knees and patellofemoral changes.  She notes that the injections in the past have helped.  She has had no acute injury to either knee.  She notes that her knees are starting to bother her slightly.  She is wanting to get these injected prior to her moving to [facility].  She has no upcoming disc scheduled surgery on either knee.   Physical exam: General well-developed well-nourished female no acute distress mood and affect appropriate Bilateral knees good range of motion bilateral knees.  Patellofemoral crepitus right greater than left knee.  Procedures: Visit Diagnoses:  1. Unilateral primary osteoarthritis, right knee   2. Unilateral primary osteoarthritis, left knee     Large Joint Inj: bilateral knee on 06/27/2022 2:11 PM Indications: pain Details: 22 G 1.5 in needle, anterolateral approach  Arthrogram: No  Medications (Right): 88 mg Hyaluronan 88 MG/4ML Medications (Left): 88 mg Hyaluronan 88 MG/4ML Outcome: tolerated well, no immediate complications Procedure, treatment alternatives, risks and benefits explained, specific risks discussed. Consent was given by the patient. Immediately prior to procedure a time out was called to verify the correct patient, procedure, equipment, support staff and site/side marked as required. Patient was prepped and draped in the usual sterile fashion.    Plan: Patient tolerated injections well.  She will find an orthopedic surgeon in [place].  She can always follow-up with [place] as needed.

## 2022-07-11 ENCOUNTER — Encounter: Payer: Self-pay | Admitting: Radiology

## 2022-08-21 ENCOUNTER — Ambulatory Visit: Payer: Medicare Other | Admitting: Dermatology

## 2023-01-13 ENCOUNTER — Telehealth: Payer: Self-pay | Admitting: Acute Care

## 2023-01-13 NOTE — Telephone Encounter (Signed)
Patient would like to know what kind of doctor she would need to go to for aneurysm. Patient phone number is [phone].

## 2023-01-14 NOTE — Telephone Encounter (Signed)
Spoke with patient by phone.  She has moved to [place] and is concerned about having a colonoscopy with the history of splenic aneurysm. Kandice Robinsons, NP had previously discussed th aneurysm finding with the patient at an OV.  Patient feels anxious about it and will start with general surgery office to see if they would see her. If not, they could advise if she needs vascular surgeon. She wants expert opinion before scheduling her colonoscopy.  Patient is no longer in our area for follow up LDCT.
